# Patient Record
Sex: Female | Born: 1940 | Race: Black or African American | Hispanic: No | Marital: Married | Smoking: Never smoker
Health system: Southern US, Community
[De-identification: ages and names within clinical notes are randomized; demographics above are authoritative.]

## PROBLEM LIST (undated history)

## (undated) DIAGNOSIS — K219 Gastro-esophageal reflux disease without esophagitis: Secondary | ICD-10-CM

## (undated) DIAGNOSIS — M1990 Unspecified osteoarthritis, unspecified site: Secondary | ICD-10-CM

## (undated) DIAGNOSIS — E785 Hyperlipidemia, unspecified: Secondary | ICD-10-CM

## (undated) DIAGNOSIS — I10 Essential (primary) hypertension: Secondary | ICD-10-CM

## (undated) DIAGNOSIS — N189 Chronic kidney disease, unspecified: Secondary | ICD-10-CM

## (undated) HISTORY — PX: ABDOMINAL HYSTERECTOMY: SHX81

## (undated) HISTORY — PX: JOINT REPLACEMENT: SHX530

---

## 2003-12-16 ENCOUNTER — Inpatient Hospital Stay (HOSPITAL_COMMUNITY): Admission: RE | Admit: 2003-12-16 | Discharge: 2003-12-20 | Payer: Self-pay | Admitting: Orthopedic Surgery

## 2004-05-01 ENCOUNTER — Observation Stay (HOSPITAL_COMMUNITY): Admission: RE | Admit: 2004-05-01 | Discharge: 2004-05-02 | Payer: Self-pay | Admitting: Orthopedic Surgery

## 2011-08-10 ENCOUNTER — Encounter (HOSPITAL_COMMUNITY): Payer: Self-pay | Admitting: Respiratory Therapy

## 2011-08-13 ENCOUNTER — Encounter (HOSPITAL_COMMUNITY): Payer: Self-pay

## 2011-08-13 ENCOUNTER — Encounter (HOSPITAL_COMMUNITY)
Admission: RE | Admit: 2011-08-13 | Discharge: 2011-08-13 | Disposition: A | Discharge status: Home / Self Care | Payer: Medicare Other | Source: Ambulatory Visit | Attending: Orthopedic Surgery | Admitting: Orthopedic Surgery

## 2011-08-13 HISTORY — DX: Chronic kidney disease, unspecified: N18.9

## 2011-08-13 HISTORY — DX: Gastro-esophageal reflux disease without esophagitis: K21.9

## 2011-08-13 HISTORY — DX: Unspecified osteoarthritis, unspecified site: M19.90

## 2011-08-13 HISTORY — DX: Essential (primary) hypertension: I10

## 2011-08-13 HISTORY — DX: Hyperlipidemia, unspecified: E78.5

## 2011-08-13 LAB — DIFFERENTIAL
Basophils Absolute: 0.1 10*3/uL (ref 0.0–0.1)
Lymphocytes Relative: 37 % (ref 12–46)
Monocytes Absolute: 0.5 10*3/uL (ref 0.1–1.0)
Monocytes Relative: 10 % (ref 3–12)
Neutro Abs: 2.4 10*3/uL (ref 1.7–7.7)
Neutrophils Relative %: 47 % (ref 43–77)

## 2011-08-13 LAB — BASIC METABOLIC PANEL
Chloride: 98 mEq/L (ref 96–112)
GFR calc Af Amer: 66 mL/min — ABNORMAL LOW (ref >90)
GFR calc non Af Amer: 57 mL/min — ABNORMAL LOW (ref >90)
Potassium: 3.7 mEq/L (ref 3.5–5.1)
Sodium: 136 mEq/L (ref 135–145)

## 2011-08-13 LAB — CBC
HCT: 38 % (ref 36.0–46.0)
Hemoglobin: 12.2 g/dL (ref 12.0–15.0)
WBC: 5.2 10*3/uL (ref 4.0–10.5)

## 2011-08-13 LAB — URINE MICROSCOPIC-ADD ON

## 2011-08-13 LAB — PROTIME-INR: INR: 0.92 (ref 0.00–1.49)

## 2011-08-13 LAB — TYPE AND SCREEN: Antibody Screen: NEGATIVE

## 2011-08-13 LAB — URINALYSIS, ROUTINE W REFLEX MICROSCOPIC
Bilirubin Urine: NEGATIVE
Hgb urine dipstick: NEGATIVE
Urobilinogen, UA: 0.2 mg/dL (ref 0.0–1.0)

## 2011-08-13 LAB — SURGICAL PCR SCREEN: Staphylococcus aureus: NEGATIVE

## 2011-08-13 LAB — APTT: aPTT: 33 seconds (ref 24–37)

## 2011-08-13 NOTE — Pre-Procedure Instructions (Signed)
20 Tanya Munoz  08/13/2011   Your procedure is scheduled on:  08-17-2011 Friday  Report to [facility] at 5:30 AM.  Call this number if you have problems the morning of surgery: [phone]   Remember:   Do not eat food:After Midnight.  Do not drink clear liquids: 4 Hours before arrival.  Take these medicines the morning of surgery with A SIP OF WATER: carvedilol   Do not wear jewelry, make-up or nail polish.  Do not wear lotions, powders, or perfumes. You may wear deodorant.  Do not shave 48 hours prior to surgery.  Do not bring valuables to the hospital.  Contacts, dentures or bridgework may not be worn into surgery.  Leave suitcase in the car. After surgery it may be brought to your room.  For patients admitted to the hospital, checkout time is 11:00 AM the day of discharge.   Patients discharged the day of surgery will not be allowed to drive home.  Name and phone number of your driver: husband brendon neyens   [phone]  Special Instructions: Incentive Spirometry - Practice and bring it with you on the day of surgery. and CHG Shower Use Special Wash: 1/2 bottle night before surgery and 1/2 bottle morning of surgery.   Please read over the following fact sheets that you were given: Pain Booklet, Coughing and Deep Breathing, Blood Transfusion Information, MRSA Information and Surgical Site Infection Prevention

## 2011-08-13 NOTE — Progress Notes (Signed)
Ekg, cxr, echo, stress test requested from [facility] in [place] per Mining engineer.

## 2011-08-14 NOTE — H&P (Signed)
NAME:  Tanya Munoz, Tanya Munoz NO.:  [id number]  MEDICAL RECORD NO.:  [id number]  LOCATION:  [facility]                        FACILITY:  [facility]  PHYSICIAN:  Marcello Moores B. Siddhanth Denk, P.A.  DATE OF BIRTH:  1941-07-08  DATE OF ADMISSION:  07/12/2011 DATE OF DISCHARGE:                             HISTORY & PHYSICAL   CHIEF COMPLAINT:  Left knee pain.  HISTORY OF PRESENT ILLNESS:  The patient is a 70 year old female with worsening left knee pain secondary to end-stage osteoarthritis.  The patient elected to have a left total knee arthroplasty by Dr. Esmond Plants to decrease pain and increase function.  PAST MEDICAL HISTORY:  Hypertension, hyperlipidemia, diabetes mellitus, GERD, and chronic kidney disease along with osteoarthritis.  The patient has no known drug allergies.  CURRENT MEDICATIONS: 1. Aspirin 81 mg daily. 2. Carvedilol 3.125 mg b.i.d. 3. Garlic daily. 4. Losartan/hydrochlorothiazide 100/25 mg daily. 5. Lovastatin 40 mg daily. 6. Metformin 850 mg b.i.d. 7. Omega-3 acid. 8. Lovaza 1 g daily. 9. Tramadol 50 mg 1-2 tabs q.4-6 hours p.r.n. pain.  The patient does not smoke or use alcohol, and is retired.  REVIEW OF SYSTEMS:  Review of systems shows pain with ambulation,  pain with flexion and extension of the left lower extremity.  The patient otherwise review of systems is negative.  PHYSICAL EXAMINATION:  GENERAL:  Patient is healthy and appropriate 38- year-old female, in no acute distress.  Pleasant mood and affect, alert and oriented x3. HEAD AND NECK:  Shows cranial nerves II-XII grossly intact. MUSCULOSKELETAL:  Cervical spine shows full range of motion in difficulty with no tenderness. NEUROVASCULARLY:  She is intact, bilateral upper extremities. CHEST:  Active breath sounds bilaterally.  No wheezes, rhonchi, or rales. ABDOMEN:  Nontender, nondistended. EXTREMITIES:  Bilateral lower extremities show some moderate tenderness in the medial joint line,  especially in the left knee with a trace effusion,  mild crepitus range of motion.  No positive mass.  Calves nontender.  Trace edema, but neurovascularly she is intact with no rashes.  She does have mildly antalgic gait.  Radiographs reviewed showing end-stage osteoarthritis to left knee.  IMPRESSION:  End-stage osteoarthritis, left knee.  PLAN OF ACTION:  Left total knee arthroplasty by Dr. Esmond Plants.     Darlena Koval B. Mayo Ao.     TBD/MEDQ  D:  08/14/2011  T:  08/14/2011  Job:  [id number]

## 2011-08-16 MED ORDER — CEFAZOLIN SODIUM-DEXTROSE 2-3 GM-% IV SOLR
2.0000 g | Freq: Once | INTRAVENOUS | Status: AC
  Administered 2011-08-17: 2 g via INTRAVENOUS
  Filled 2011-08-16: qty 50

## 2011-08-17 ENCOUNTER — Inpatient Hospital Stay (HOSPITAL_COMMUNITY): Payer: Medicare Other

## 2011-08-17 ENCOUNTER — Encounter (HOSPITAL_COMMUNITY): Payer: Self-pay | Admitting: *Deleted

## 2011-08-17 ENCOUNTER — Encounter (HOSPITAL_COMMUNITY): Payer: Self-pay | Admitting: Anesthesiology

## 2011-08-17 ENCOUNTER — Inpatient Hospital Stay (HOSPITAL_COMMUNITY)
Admission: RE | Admit: 2011-08-17 | Discharge: 2011-08-20 | DRG: 470 | Disposition: A | Discharge status: Home Health | Payer: Medicare Other | Source: Ambulatory Visit | Attending: Orthopedic Surgery | Admitting: Orthopedic Surgery

## 2011-08-17 ENCOUNTER — Encounter (HOSPITAL_COMMUNITY): Payer: Self-pay | Admitting: Orthopedic Surgery

## 2011-08-17 ENCOUNTER — Other Ambulatory Visit: Payer: Self-pay

## 2011-08-17 ENCOUNTER — Inpatient Hospital Stay (HOSPITAL_COMMUNITY): Payer: Medicare Other | Admitting: Anesthesiology

## 2011-08-17 ENCOUNTER — Encounter (HOSPITAL_COMMUNITY)
Admission: RE | Disposition: A | Discharge status: Home Health | Payer: Self-pay | Source: Ambulatory Visit | Attending: Orthopedic Surgery

## 2011-08-17 DIAGNOSIS — Z7982 Long term (current) use of aspirin: Secondary | ICD-10-CM

## 2011-08-17 DIAGNOSIS — K219 Gastro-esophageal reflux disease without esophagitis: Secondary | ICD-10-CM | POA: Diagnosis present

## 2011-08-17 DIAGNOSIS — M1710 Unilateral primary osteoarthritis, unspecified knee: Principal | ICD-10-CM | POA: Diagnosis present

## 2011-08-17 DIAGNOSIS — IMO0002 Reserved for concepts with insufficient information to code with codable children: Secondary | ICD-10-CM | POA: Diagnosis present

## 2011-08-17 DIAGNOSIS — Z01810 Encounter for preprocedural cardiovascular examination: Secondary | ICD-10-CM

## 2011-08-17 DIAGNOSIS — Z01812 Encounter for preprocedural laboratory examination: Secondary | ICD-10-CM

## 2011-08-17 DIAGNOSIS — E785 Hyperlipidemia, unspecified: Secondary | ICD-10-CM | POA: Diagnosis present

## 2011-08-17 DIAGNOSIS — E119 Type 2 diabetes mellitus without complications: Secondary | ICD-10-CM | POA: Diagnosis present

## 2011-08-17 DIAGNOSIS — N189 Chronic kidney disease, unspecified: Secondary | ICD-10-CM | POA: Diagnosis present

## 2011-08-17 DIAGNOSIS — Z7901 Long term (current) use of anticoagulants: Secondary | ICD-10-CM

## 2011-08-17 DIAGNOSIS — I129 Hypertensive chronic kidney disease with stage 1 through stage 4 chronic kidney disease, or unspecified chronic kidney disease: Secondary | ICD-10-CM | POA: Diagnosis present

## 2011-08-17 DIAGNOSIS — Z87440 Personal history of urinary (tract) infections: Secondary | ICD-10-CM

## 2011-08-17 DIAGNOSIS — Z79899 Other long term (current) drug therapy: Secondary | ICD-10-CM

## 2011-08-17 HISTORY — PX: TOTAL KNEE ARTHROPLASTY: SHX125

## 2011-08-17 LAB — DIFFERENTIAL
Basophils Absolute: 0 10*3/uL (ref 0.0–0.1)
Basophils Relative: 0 % (ref 0–1)
Eosinophils Absolute: 0.1 10*3/uL (ref 0.0–0.7)
Eosinophils Relative: 1 % (ref 0–5)
Neutrophils Relative %: 77 % (ref 43–77)

## 2011-08-17 LAB — GLUCOSE, CAPILLARY
Glucose-Capillary: 129 mg/dL — ABNORMAL HIGH (ref 70–99)
Glucose-Capillary: 161 mg/dL — ABNORMAL HIGH (ref 70–99)

## 2011-08-17 SURGERY — ARTHROPLASTY, KNEE, TOTAL
Anesthesia: General | Site: Knee | Laterality: Left | Wound class: Clean

## 2011-08-17 MED ORDER — FLEET ENEMA 7-19 GM/118ML RE ENEM: 1.0000 | ENEMA | Freq: Every day | RECTAL | Status: DC | PRN

## 2011-08-17 MED ORDER — POTASSIUM CHLORIDE IN NACL 20-0.9 MEQ/L-% IV SOLN
INTRAVENOUS | Status: DC
  Administered 2011-08-18: 50 mL/h via INTRAVENOUS
  Administered 2011-08-18: 03:00:00 via INTRAVENOUS
  Filled 2011-08-17 (×5): qty 1000

## 2011-08-17 MED ORDER — MIDAZOLAM HCL 5 MG/5ML IJ SOLN
INTRAMUSCULAR | Status: DC | PRN
  Administered 2011-08-17: 1 mg via INTRAVENOUS

## 2011-08-17 MED ORDER — TRAMADOL HCL 50 MG PO TABS: 50.0000 mg | ORAL_TABLET | Freq: Four times a day (QID) | ORAL | Status: DC | PRN

## 2011-08-17 MED ORDER — KETOROLAC TROMETHAMINE 30 MG/ML IJ SOLN
15.0000 mg | Freq: Once | INTRAMUSCULAR | Status: AC | PRN
  Administered 2011-08-17: 30 mg via INTRAVENOUS

## 2011-08-17 MED ORDER — METOCLOPRAMIDE HCL 5 MG/ML IJ SOLN
5.0000 mg | Freq: Three times a day (TID) | INTRAMUSCULAR | Status: DC | PRN
  Filled 2011-08-17: qty 2

## 2011-08-17 MED ORDER — DOCUSATE SODIUM 100 MG PO CAPS
100.0000 mg | ORAL_CAPSULE | Freq: Two times a day (BID) | ORAL | Status: DC
  Administered 2011-08-17 – 2011-08-20 (×7): 100 mg via ORAL
  Filled 2011-08-17 (×9): qty 1

## 2011-08-17 MED ORDER — ONDANSETRON HCL 4 MG/2ML IJ SOLN: 4.0000 mg | Freq: Four times a day (QID) | INTRAMUSCULAR | Status: DC | PRN

## 2011-08-17 MED ORDER — COUMADIN BOOK
Freq: Once | Status: AC
  Administered 2011-08-17: 1
  Filled 2011-08-17: qty 1

## 2011-08-17 MED ORDER — WARFARIN SODIUM 7.5 MG PO TABS
7.5000 mg | ORAL_TABLET | Freq: Once | ORAL | Status: AC
  Administered 2011-08-17: 7.5 mg via ORAL
  Filled 2011-08-17: qty 1

## 2011-08-17 MED ORDER — ONDANSETRON HCL 4 MG/2ML IJ SOLN
INTRAMUSCULAR | Status: DC | PRN
  Administered 2011-08-17: 4 mg via INTRAVENOUS

## 2011-08-17 MED ORDER — METOCLOPRAMIDE HCL 10 MG PO TABS: 5.0000 mg | ORAL_TABLET | Freq: Three times a day (TID) | ORAL | Status: DC | PRN

## 2011-08-17 MED ORDER — ROCURONIUM BROMIDE 100 MG/10ML IV SOLN
INTRAVENOUS | Status: DC | PRN
  Administered 2011-08-17: 50 mg via INTRAVENOUS

## 2011-08-17 MED ORDER — ASPIRIN 81 MG PO TABS
81.0000 mg | ORAL_TABLET | Freq: Every day | ORAL | Status: DC
  Administered 2011-08-17 – 2011-08-19 (×3): 81 mg via ORAL
  Filled 2011-08-17 (×4): qty 1

## 2011-08-17 MED ORDER — BISACODYL 5 MG PO TBEC
10.0000 mg | DELAYED_RELEASE_TABLET | Freq: Every day | ORAL | Status: DC | PRN
  Administered 2011-08-19: 10 mg via ORAL
  Filled 2011-08-17: qty 2

## 2011-08-17 MED ORDER — FENTANYL CITRATE 0.05 MG/ML IJ SOLN
INTRAMUSCULAR | Status: DC | PRN
  Administered 2011-08-17: 50 ug via INTRAVENOUS
  Administered 2011-08-17: 25 ug via INTRAVENOUS
  Administered 2011-08-17 (×2): 50 ug via INTRAVENOUS
  Administered 2011-08-17: 25 ug via INTRAVENOUS

## 2011-08-17 MED ORDER — PHENOL 1.4 % MT LIQD
1.0000 | OROMUCOSAL | Status: DC | PRN
  Administered 2011-08-18: 1 via OROMUCOSAL
  Filled 2011-08-17: qty 177

## 2011-08-17 MED ORDER — BUPIVACAINE HCL (PF) 0.5 % IJ SOLN
INTRAMUSCULAR | Status: DC | PRN
  Administered 2011-08-17: 30 mL

## 2011-08-17 MED ORDER — OXYCODONE-ACETAMINOPHEN 5-325 MG PO TABS: 1.0000 | ORAL_TABLET | ORAL | Status: AC | PRN

## 2011-08-17 MED ORDER — POLYETHYLENE GLYCOL 3350 17 G PO PACK
17.0000 g | PACK | Freq: Every day | ORAL | Status: DC | PRN
  Administered 2011-08-18: 17 g via ORAL
  Filled 2011-08-17: qty 1

## 2011-08-17 MED ORDER — METHOCARBAMOL 100 MG/ML IJ SOLN: 500.0000 mg | Freq: Four times a day (QID) | INTRAVENOUS | Status: DC | PRN

## 2011-08-17 MED ORDER — IRON 325 (65 FE) MG PO TABS: 1.0000 | ORAL_TABLET | Freq: Two times a day (BID) | ORAL | Status: DC

## 2011-08-17 MED ORDER — MAGNESIUM HYDROXIDE 400 MG/5ML PO SUSP: 30.0000 mL | Freq: Two times a day (BID) | ORAL | Status: DC | PRN

## 2011-08-17 MED ORDER — ACETAMINOPHEN 325 MG PO TABS: 650.0000 mg | ORAL_TABLET | Freq: Four times a day (QID) | ORAL | Status: DC | PRN

## 2011-08-17 MED ORDER — CEFAZOLIN SODIUM-DEXTROSE 2-3 GM-% IV SOLR
2.0000 g | Freq: Four times a day (QID) | INTRAVENOUS | Status: AC
  Administered 2011-08-17 – 2011-08-18 (×2): 2 g via INTRAVENOUS
  Filled 2011-08-17 (×4): qty 50

## 2011-08-17 MED ORDER — MEPERIDINE HCL 25 MG/ML IJ SOLN: 6.2500 mg | INTRAMUSCULAR | Status: DC | PRN

## 2011-08-17 MED ORDER — HYDROMORPHONE HCL PF 1 MG/ML IJ SOLN: 0.5000 mg | INTRAMUSCULAR | Status: DC | PRN

## 2011-08-17 MED ORDER — HYDROMORPHONE HCL PF 1 MG/ML IJ SOLN
INTRAMUSCULAR | Status: AC
  Filled 2011-08-17: qty 1

## 2011-08-17 MED ORDER — CARVEDILOL 3.125 MG PO TABS
3.1250 mg | ORAL_TABLET | Freq: Two times a day (BID) | ORAL | Status: DC
  Administered 2011-08-17 – 2011-08-20 (×6): 3.125 mg via ORAL
  Filled 2011-08-17 (×9): qty 1

## 2011-08-17 MED ORDER — WARFARIN SODIUM 5 MG PO TABS: 5.0000 mg | ORAL_TABLET | Freq: Every day | ORAL | Status: DC

## 2011-08-17 MED ORDER — METHOCARBAMOL 500 MG PO TABS
500.0000 mg | ORAL_TABLET | Freq: Four times a day (QID) | ORAL | Status: DC | PRN
  Administered 2011-08-17 – 2011-08-19 (×4): 500 mg via ORAL
  Filled 2011-08-17 (×5): qty 1

## 2011-08-17 MED ORDER — METHOCARBAMOL 500 MG PO TABS: 500.0000 mg | ORAL_TABLET | Freq: Four times a day (QID) | ORAL | Status: AC | PRN

## 2011-08-17 MED ORDER — FERROUS SULFATE 325 (65 FE) MG PO TABS
325.0000 mg | ORAL_TABLET | Freq: Three times a day (TID) | ORAL | Status: DC
  Administered 2011-08-17 – 2011-08-20 (×9): 325 mg via ORAL
  Filled 2011-08-17 (×12): qty 1

## 2011-08-17 MED ORDER — LOSARTAN POTASSIUM 50 MG PO TABS
100.0000 mg | ORAL_TABLET | Freq: Every day | ORAL | Status: DC
  Administered 2011-08-18 – 2011-08-20 (×3): 100 mg via ORAL
  Filled 2011-08-17 (×4): qty 2

## 2011-08-17 MED ORDER — LOSARTAN POTASSIUM-HCTZ 100-25 MG PO TABS: 1.0000 | ORAL_TABLET | Freq: Every day | ORAL | Status: DC

## 2011-08-17 MED ORDER — OXYCODONE-ACETAMINOPHEN 5-325 MG PO TABS
1.0000 | ORAL_TABLET | ORAL | Status: DC | PRN
  Administered 2011-08-17 – 2011-08-20 (×13): 2 via ORAL
  Filled 2011-08-17 (×13): qty 2

## 2011-08-17 MED ORDER — LACTATED RINGERS IV SOLN
INTRAVENOUS | Status: DC | PRN
  Administered 2011-08-17 (×3): via INTRAVENOUS

## 2011-08-17 MED ORDER — WARFARIN VIDEO
Freq: Once | Status: AC
  Administered 2011-08-18: 13:00:00
  Filled 2011-08-17: qty 1

## 2011-08-17 MED ORDER — SIMVASTATIN 20 MG PO TABS
20.0000 mg | ORAL_TABLET | Freq: Every day | ORAL | Status: DC
  Administered 2011-08-17 – 2011-08-19 (×3): 20 mg via ORAL
  Filled 2011-08-17 (×4): qty 1

## 2011-08-17 MED ORDER — PROPOFOL 10 MG/ML IV EMUL
INTRAVENOUS | Status: DC | PRN
  Administered 2011-08-17: 120 mg via INTRAVENOUS

## 2011-08-17 MED ORDER — PROMETHAZINE HCL 25 MG/ML IJ SOLN: 6.2500 mg | INTRAMUSCULAR | Status: DC | PRN

## 2011-08-17 MED ORDER — KETOROLAC TROMETHAMINE 30 MG/ML IJ SOLN
INTRAMUSCULAR | Status: AC
  Filled 2011-08-17: qty 1

## 2011-08-17 MED ORDER — CELECOXIB 200 MG PO CAPS: 200.0000 mg | ORAL_CAPSULE | Freq: Every day | ORAL | Status: DC

## 2011-08-17 MED ORDER — OMEGA-3-ACID ETHYL ESTERS 1 G PO CAPS
1.0000 g | ORAL_CAPSULE | Freq: Every day | ORAL | Status: DC
  Administered 2011-08-17 – 2011-08-20 (×4): 1 g via ORAL
  Filled 2011-08-17 (×4): qty 1

## 2011-08-17 MED ORDER — MENTHOL 3 MG MT LOZG: 1.0000 | LOZENGE | OROMUCOSAL | Status: DC | PRN

## 2011-08-17 MED ORDER — GLYCOPYRROLATE 0.2 MG/ML IJ SOLN
INTRAMUSCULAR | Status: DC | PRN
  Administered 2011-08-17: .8 mg via INTRAVENOUS

## 2011-08-17 MED ORDER — HYDROCHLOROTHIAZIDE 25 MG PO TABS
25.0000 mg | ORAL_TABLET | Freq: Every day | ORAL | Status: DC
  Administered 2011-08-17 – 2011-08-20 (×4): 25 mg via ORAL
  Filled 2011-08-17 (×4): qty 1

## 2011-08-17 MED ORDER — NEOSTIGMINE METHYLSULFATE 1 MG/ML IJ SOLN
INTRAMUSCULAR | Status: DC | PRN
  Administered 2011-08-17: 4 mg via INTRAVENOUS

## 2011-08-17 MED ORDER — HYDROMORPHONE HCL PF 1 MG/ML IJ SOLN
0.2500 mg | INTRAMUSCULAR | Status: DC | PRN
  Administered 2011-08-17 (×4): 0.5 mg via INTRAVENOUS

## 2011-08-17 MED ORDER — METFORMIN HCL 850 MG PO TABS
850.0000 mg | ORAL_TABLET | Freq: Two times a day (BID) | ORAL | Status: DC
  Administered 2011-08-17 – 2011-08-20 (×6): 850 mg via ORAL
  Filled 2011-08-17 (×10): qty 1

## 2011-08-17 MED ORDER — SODIUM CHLORIDE 0.9 % IR SOLN
Status: DC | PRN
  Administered 2011-08-17: 1000 mL
  Administered 2011-08-17: 3000 mL

## 2011-08-17 SURGICAL SUPPLY — 65 items
BANDAGE ELASTIC 4 VELCRO ST LF (GAUZE/BANDAGES/DRESSINGS) ×2 IMPLANT
BANDAGE ELASTIC 6 VELCRO ST LF (GAUZE/BANDAGES/DRESSINGS) ×2 IMPLANT
BANDAGE ESMARK 6X9 LF (GAUZE/BANDAGES/DRESSINGS) ×1 IMPLANT
BANDAGE GAUZE ELAST BULKY 4 IN (GAUZE/BANDAGES/DRESSINGS) ×2 IMPLANT
BLADE SAG 18X100X1.27 (BLADE) ×2 IMPLANT
BLADE SAW SGTL 13.0X1.19X90.0M (BLADE) ×2 IMPLANT
BLADE SURG 10 STRL SS (BLADE) ×2 IMPLANT
BNDG ELASTIC 6X10 VLCR STRL LF (GAUZE/BANDAGES/DRESSINGS) ×2 IMPLANT
BNDG ESMARK 6X9 LF (GAUZE/BANDAGES/DRESSINGS) ×2
BOWL SMART MIX CTS (DISPOSABLE) ×2 IMPLANT
CEMENT HV SMART SET (Cement) ×4 IMPLANT
CLOSURE STERI STRIP 1/2 X4 (GAUZE/BANDAGES/DRESSINGS) ×2 IMPLANT
CLOTH BEACON ORANGE TIMEOUT ST (SAFETY) ×2 IMPLANT
COVER BACK TABLE 24X17X13 BIG (DRAPES) IMPLANT
COVER SURGICAL LIGHT HANDLE (MISCELLANEOUS) ×2 IMPLANT
CUFF TOURNIQUET SINGLE 34IN LL (TOURNIQUET CUFF) ×2 IMPLANT
CUFF TOURNIQUET SINGLE 44IN (TOURNIQUET CUFF) IMPLANT
DRAPE EXTREMITY T 121X128X90 (DRAPE) ×2 IMPLANT
DRAPE PROXIMA HALF (DRAPES) ×2 IMPLANT
DRAPE U-SHAPE 47X51 STRL (DRAPES) ×2 IMPLANT
DRSG ADAPTIC 3X8 NADH LF (GAUZE/BANDAGES/DRESSINGS) ×2 IMPLANT
DRSG PAD ABDOMINAL 8X10 ST (GAUZE/BANDAGES/DRESSINGS) ×2 IMPLANT
DURAPREP 26ML APPLICATOR (WOUND CARE) ×2 IMPLANT
ELECT CAUTERY BLADE 6.4 (BLADE) ×2 IMPLANT
ELECT REM PT RETURN 9FT ADLT (ELECTROSURGICAL) ×2
ELECTRODE REM PT RTRN 9FT ADLT (ELECTROSURGICAL) ×1 IMPLANT
FACESHIELD LNG OPTICON STERILE (SAFETY) IMPLANT
GLOVE BIOGEL PI ORTHO PRO 7.5 (GLOVE) ×1
GLOVE BIOGEL PI ORTHO PRO SZ7 (GLOVE) ×1
GLOVE BIOGEL PI ORTHO PRO SZ8 (GLOVE) ×1
GLOVE ECLIPSE 8.5 STRL (GLOVE) ×2 IMPLANT
GLOVE ORTHO TXT STRL SZ7.5 (GLOVE) ×2 IMPLANT
GLOVE PI ORTHO PRO STRL 7.5 (GLOVE) ×1 IMPLANT
GLOVE PI ORTHO PRO STRL SZ7 (GLOVE) ×1 IMPLANT
GLOVE PI ORTHO PRO STRL SZ8 (GLOVE) ×1 IMPLANT
GLOVE SURG ORTHO 8.5 STRL (GLOVE) ×2 IMPLANT
GLOVE SURG SS PI 7.0 STRL IVOR (GLOVE) ×2 IMPLANT
GLOVE SURG SS PI 8.5 STRL IVOR (GLOVE) ×1
GLOVE SURG SS PI 8.5 STRL STRW (GLOVE) ×1 IMPLANT
GOWN PREVENTION PLUS XXLARGE (GOWN DISPOSABLE) ×8 IMPLANT
GOWN STRL NON-REIN LRG LVL3 (GOWN DISPOSABLE) IMPLANT
HANDPIECE INTERPULSE COAX TIP (DISPOSABLE) ×1
IMMOBILIZER KNEE 22 UNIV (SOFTGOODS) ×2 IMPLANT
KIT BASIN OR (CUSTOM PROCEDURE TRAY) ×2 IMPLANT
KIT MANIFOLD (MISCELLANEOUS) ×2 IMPLANT
KIT ROOM TURNOVER OR (KITS) ×2 IMPLANT
MANIFOLD NEPTUNE II (INSTRUMENTS) ×2 IMPLANT
NS IRRIG 1000ML POUR BTL (IV SOLUTION) ×2 IMPLANT
PACK TOTAL JOINT (CUSTOM PROCEDURE TRAY) ×2 IMPLANT
PAD ARMBOARD 7.5X6 YLW CONV (MISCELLANEOUS) ×2 IMPLANT
SET HNDPC FAN SPRY TIP SCT (DISPOSABLE) ×1 IMPLANT
SPONGE GAUZE 4X4 12PLY (GAUZE/BANDAGES/DRESSINGS) ×2 IMPLANT
STRIP CLOSURE SKIN 1/2X4 (GAUZE/BANDAGES/DRESSINGS) IMPLANT
SUCTION FRAZIER TIP 10 FR DISP (SUCTIONS) ×2 IMPLANT
SUT MNCRL AB 3-0 PS2 18 (SUTURE) ×2 IMPLANT
SUT VIC AB 0 CT1 27 (SUTURE) ×1
SUT VIC AB 0 CT1 27XBRD ANBCTR (SUTURE) ×1 IMPLANT
SUT VIC AB 1 CT1 27 (SUTURE) ×2
SUT VIC AB 1 CT1 27XBRD ANBCTR (SUTURE) ×2 IMPLANT
SUT VIC AB 2-0 CT1 27 (SUTURE) ×2
SUT VIC AB 2-0 CT1 TAPERPNT 27 (SUTURE) ×2 IMPLANT
TOWEL OR 17X24 6PK STRL BLUE (TOWEL DISPOSABLE) ×2 IMPLANT
TOWEL OR 17X26 10 PK STRL BLUE (TOWEL DISPOSABLE) ×2 IMPLANT
TRAY FOLEY CATH 14FR (SET/KITS/TRAYS/PACK) ×2 IMPLANT
WATER STERILE IRR 1000ML POUR (IV SOLUTION) ×8 IMPLANT

## 2011-08-17 NOTE — Preoperative (Signed)
Beta Blockers   Reason not to administer Beta Blockers:Coreg 0330 08/17/2011

## 2011-08-17 NOTE — Discharge Summary (Signed)
Physician Discharge Summary  Patient ID: Tanya Munoz MRN: [id number] DOB/AGE: [birth year] 70 y.o.  Admit date: 08/17/2011 Discharge date: 08/17/2011  Admission Diagnoses:  Principal Problem:  *Osteoarthrosis, unspecified whether generalized or localized, lower leg   Discharge Diagnoses:  Same   Surgeries: Procedure(s): TOTAL KNEE ARTHROPLASTY on 08/17/2011   Consultants: Physcial Therapy, Occupational Therapy, Social Work Discharged Condition: Stable  Hospital Course: Tanya Munoz is an 70 y.o. female who was admitted 08/17/2011 with a chief complaint of No chief complaint on file. , and found to have a diagnosis of Osteoarthrosis, unspecified whether generalized or localized, lower leg.  They were brought to the operating room on 08/17/2011 and underwent the above named procedures.    The patient had an uncomplicated hospital course and was stable for discharge.  Recent vital signs:  Filed Vitals:   08/17/11 0559  BP: 107/71  Pulse: 80  Temp: 98.4 F (36.9 C)  Resp: 18    Recent laboratory studies:  Results for orders placed during the hospital encounter of 08/13/11  APTT      Component Value Range   aPTT 33  24 - 37 (seconds)  BASIC METABOLIC PANEL      Component Value Range   Sodium 136  135 - 145 (mEq/L)   Potassium 3.7  3.5 - 5.1 (mEq/L)   Chloride 98  96 - 112 (mEq/L)   CO2 25  19 - 32 (mEq/L)   Glucose, Bld 139 (*) 70 - 99 (mg/dL)   BUN 19  6 - 23 (mg/dL)   Creatinine, Ser 0.97  0.50 - 1.10 (mg/dL)   Calcium 10.3  8.4 - 10.5 (mg/dL)   GFR calc non Af Amer 57 (*) >90 (mL/min)   GFR calc Af Amer 66 (*) >90 (mL/min)  CBC      Component Value Range   WBC 5.2  4.0 - 10.5 (K/uL)   RBC 4.16  3.87 - 5.11 (MIL/uL)   Hemoglobin 12.2  12.0 - 15.0 (g/dL)   HCT 38.0  36.0 - 46.0 (%)   MCV 91.3  78.0 - 100.0 (fL)   MCH 29.3  26.0 - 34.0 (pg)   MCHC 32.1  30.0 - 36.0 (g/dL)   RDW 13.3  11.5 - 15.5 (%)   Platelets 249  150 - 400 (K/uL)  DIFFERENTIAL    Component Value Range   Neutrophils Relative 47  43 - 77 (%)   Neutro Abs 2.4  1.7 - 7.7 (K/uL)   Lymphocytes Relative 37  12 - 46 (%)   Lymphs Abs 1.9  0.7 - 4.0 (K/uL)   Monocytes Relative 10  3 - 12 (%)   Monocytes Absolute 0.5  0.1 - 1.0 (K/uL)   Eosinophils Relative 5  0 - 5 (%)   Eosinophils Absolute 0.3  0.0 - 0.7 (K/uL)   Basophils Relative 1  0 - 1 (%)   Basophils Absolute 0.1  0.0 - 0.1 (K/uL)  PROTIME-INR      Component Value Range   Prothrombin Time 12.6  11.6 - 15.2 (seconds)   INR 0.92  0.00 - 1.49   URINALYSIS, ROUTINE W REFLEX MICROSCOPIC      Component Value Range   Color, Urine YELLOW  YELLOW    Appearance CLOUDY (*) CLEAR    Specific Gravity, Urine 1.018  1.005 - 1.030    pH 6.5  5.0 - 8.0    Glucose, UA NEGATIVE  NEGATIVE (mg/dL)   Hgb urine dipstick NEGATIVE  NEGATIVE  Bilirubin Urine NEGATIVE  NEGATIVE    Ketones, ur NEGATIVE  NEGATIVE (mg/dL)   Protein, ur NEGATIVE  NEGATIVE (mg/dL)   Urobilinogen, UA 0.2  0.0 - 1.0 (mg/dL)   Nitrite POSITIVE (*) NEGATIVE    Leukocytes, UA MODERATE (*) NEGATIVE   TYPE AND SCREEN      Component Value Range   ABO/RH(D) O POS     Antibody Screen NEG     Sample Expiration 08/27/2011    SURGICAL PCR SCREEN      Component Value Range   MRSA, PCR NEGATIVE  NEGATIVE    Staphylococcus aureus NEGATIVE  NEGATIVE   URINE MICROSCOPIC-ADD ON      Component Value Range   Squamous Epithelial / LPF FEW (*) RARE    WBC, UA 11-20  <3 (WBC/hpf)   RBC / HPF 0-2  <3 (RBC/hpf)   Bacteria, UA MANY (*) RARE    Urine-Other MUCOUS PRESENT    ABO/RH      Component Value Range   ABO/RH(D) O POS      Discharge Medications:   Current Discharge Medication List    START taking these medications   Details  Ferrous Sulfate (IRON) 325 (65 FE) MG TABS Take 1 tablet by mouth 2 (two) times daily. Qty: 30 each, Refills: 0    methocarbamol (ROBAXIN) 500 MG tablet Take 1 tablet (500 mg total) by mouth 4 (four) times daily as needed (for  spasms). Qty: 60 tablet, Refills: 1    oxyCODONE-acetaminophen (ROXICET) 5-325 MG per tablet Take 1-2 tablets by mouth every 4 (four) hours as needed for pain. Qty: 60 tablet, Refills: 0    warfarin (COUMADIN) 5 MG tablet Take 1 tablet (5 mg total) by mouth daily. As directed per home health pharmacy Qty: 40 tablet, Refills: 0      CONTINUE these medications which have NOT CHANGED   Details  aspirin 81 MG tablet Take 81 mg by mouth daily.      carvedilol (COREG) 3.125 MG tablet Take 3.125 mg by mouth 2 (two) times daily with a meal.      GARLIC PO Take 1 tablet by mouth daily.      losartan-hydrochlorothiazide (HYZAAR) 100-25 MG per tablet Take 1 tablet by mouth daily.      lovastatin (MEVACOR) 40 MG tablet Take 40 mg by mouth at bedtime.      metFORMIN (GLUCOPHAGE) 850 MG tablet Take 850 mg by mouth 2 (two) times daily with a meal.      omega-3 acid ethyl esters (LOVAZA) 1 G capsule Take 1 g by mouth daily.      traMADol (ULTRAM) 50 MG tablet Take 50 mg by mouth every 6 (six) hours as needed. Maximum dose= 8 tablets per day For pain         Diagnostic Studies: Dg Chest 2 View  08/17/2011  *RADIOLOGY REPORT*  Clinical Data: Preoperative chest radiograph for left total knee replacement.  CHEST - 2 VIEW  Comparison: Chest radiograph performed 12/13/2003  Findings: The lungs are well-aerated and clear.  There is no evidence of focal opacification, pleural effusion or pneumothorax.  The heart is normal in size; calcification is noted within the aortic arch.  No acute osseous abnormalities are seen.  Clips are noted within the right upper quadrant, reflecting prior cholecystectomy.  IMPRESSION: No acute cardiopulmonary process seen.  Original Report Authenticated By: Santa Lighter, M.D.    Disposition:     Follow-up Information    Follow up with Krithik Mapel,STEVEN R.  Make an appointment in 2 weeks.   Contact information:   [facility] [address], [place] [id number]           Signed: Augustin Schooling 08/17/2011, 10:00 AM

## 2011-08-17 NOTE — Anesthesia Procedure Notes (Addendum)
Anesthesia Regional Block:  Femoral nerve block  Pre-Anesthetic Checklist: ,, timeout performed, Correct Patient, Correct Site, Correct Laterality, Correct Procedure, Correct Position, site marked, Risks and benefits discussed, at surgeon's request and post-op pain management   Prep: Betadine       Needles:  Injection technique: Single-shot  Needle Type: Stimulator Needle - 80      Needle Gauge: 22 and 22 G  Needle insertion depth: 6 cm   Additional Needles:  Procedures: nerve stimulator Femoral nerve block  Nerve Stimulator or Paresthesia:  Response: Twitch elicited, 0.8 mA,   Additional Responses:   Narrative:  Start time: 08/17/2011 7:00 AM End time: 08/17/2011 7:13 AM  Performed by: Personally   Additional Notes: BP cuff, EKG monitors applied. Sedation begun. Femoral artery palpated for location of nerve. After nerve location anesthetic injected incrementally, slowly , and after neg aspirations. Tolerated well.  Femoral nerve block Date/Time: 08/17/2011 7:49 AM Performed by: Williemae Area Pre-anesthesia Checklist: Patient identified, Emergency Drugs available, Suction available and Patient being monitored Patient Re-evaluated:Patient Re-evaluated prior to inductionOxygen Delivery Method: Circle System Utilized Preoxygenation: Pre-oxygenation with 100% oxygen Intubation Type: IV induction Ventilation: Mask ventilation without difficulty and Oral airway inserted - appropriate to patient size Laryngoscope Size: Mac and 3 Grade View: Grade I Tube type: Oral Tube size: 7.5 mm Number of attempts: 1 Airway Equipment and Method: stylet Placement Confirmation: ETT inserted through vocal cords under direct vision,  positive ETCO2 and breath sounds checked- equal and bilateral Secured at: 21 cm Tube secured with: Tape

## 2011-08-17 NOTE — Brief Op Note (Signed)
08/17/2011  9:42 AM  PATIENT:  Serita Grammes  70 y.o. female  PRE-OPERATIVE DIAGNOSIS:  Left Knee Osteoarthritis  POST-OPERATIVE DIAGNOSIS:  Left Knee Osteoarthritis  PROCEDURE:  Procedure(s): TOTAL KNEE ARTHROPLASTY  SURGEON:  Surgeon(s): Augustin Schooling  PHYSICIAN ASSISTANT:   ASSISTANTS: Ventura Bruns PA-C   ANESTHESIA:   regional and general  EBL:  Total I/O In: 1000 [I.V.:1000] Out: 425 [Urine:425]  BLOOD ADMINISTERED:none  DRAINS: none   LOCAL MEDICATIONS USED:  NONE  SPECIMEN:  No Specimen  DISPOSITION OF SPECIMEN:  N/A  COUNTS:  YES  TOURNIQUET:  * Missing tourniquet times found for documented tourniquets in log:  4973 *  DICTATION: .Other Dictation: Dictation Number [id number]  PLAN OF CARE: Admit to inpatient   PATIENT DISPOSITION:  PACU - hemodynamically stable.   Delay start of Pharmacological VTE agent (>24hrs) due to surgical blood loss or risk of bleeding:  {YES/NO/NOT APPLICABLE:20182

## 2011-08-17 NOTE — Op Note (Signed)
NAME:  Tanya Munoz, Tanya Munoz NO.:  [id number]  MEDICAL RECORD NO.:  [id number]  LOCATION:  [facility]                         FACILITY:  [facility]  PHYSICIAN:  Doran Heater. Veverly Fells, M.D. DATE OF BIRTH:  [birth year]  DATE OF PROCEDURE:  08/17/2011 DATE OF DISCHARGE:                              OPERATIVE REPORT   PREOPERATIVE DIAGNOSIS:  Left knee end-stage osteoarthritis.  POSTOPERATIVE DIAGNOSIS:  Left knee end-stage osteoarthritis.  PROCEDURE PERFORMED:  Left knee total knee replacement using DePuy Sigma rotating platform prosthesis with MBT revision tray.  ATTENDING SURGEON:  Doran Heater. Veverly Fells, MD  ASSISTANT:  Abbott Pao. Dixon, PAC.  ANESTHESIA:  General anesthesia was used plus femoral block.  ESTIMATED BLOOD LOSS:  Minimal.  FLUID REPLACEMENT:  1200 mL crystalloid.  INSTRUMENT COUNTS:  Correct.  There were no complications.  Perioperative antibiotics were given.  Tourniquet time was 1 hour and 30 minutes at 350 mmHg.  INDICATIONS:  The patient is a 70 year old female with worsening left knee pain and functional loss secondary to end-stage arthritis.  The patient requires assistance for ambulation.  She cannot ambulate more than a block secondary to pain in her knee.  She has clear bone on bone arthritis.  She has failed all measures of conservative management and presents now for a total knee replacement to restore function and eliminate pain.  Informed consent was obtained.  DESCRIPTION OF PROCEDURE:  After an adequate level of anesthesia was achieved, the patient was positioned supine on the operating table. Left leg correctly identified.  A nonsterile tourniquet placed on the left proximal thigh.  The right leg was padded appropriately.  Sterile prep and drape were done of the left knee and we called a time-out.  The left knee was then elevated and exsanguinated using Esmarch bandage.  We elevated the tourniquet to 350 mmHg.  A longitudinal midline incision was  created with the knee in flexion.  A medial parapatellar arthrotomy developed with fresh 10 blade.  Patella everted, lateral patellofemoral ligaments divided.  Distal femur entered using a step-cut drill.  Distal femoral resection using intramedullary guide set 10-mm resection, 5 degree left.  Once we did our distal femoral cut, we sized the femur anterior down using the DePuy sizer noting a size 3.  We went ahead then and drilled our holes and then placed 4 in 1 resection block resecting anterior, posterior, and chamfer cuts.  We then removed ACL, PCL tissue and meniscal tissue.  We then subluxed the tibia anteriorly and cut the tibia 90 degrees perpendicular long axis of tibia with minimal posterior slope with this posterior cruciate substituting prosthesis.  We then went ahead and resected our posterior osteophytes off the femur, checked our gaps at 10 mm and were symmetric.  We then went ahead and finished our tibial resection using the modular drilling keel punch for the MBT revision tray.  We left the trial in place.  We then resected our notch for femoral component using the oscillating saw.  We placed the size 3 left femoral component in place.  We then reduced with a size 10 poly insert trial and then went ahead and resurfaced the patella, starting at 22 mm thickness  going down to 15 mm thickness for a 32 patellar button, which we drilled the lug holes and then placed the button in place.  We ranged the knee, it was nice and stable in flexion and extension.  The patellar tracking was normal with a no-touch technique.  We then removed all trial components, pulse irrigated thoroughly, dried the bone thoroughly, and then went ahead and cemented the components into place. Size 3 MBT revision tibia, size 3 left femur, and then we used a 10 poly insert into place and extension under pressure while we allowed the cement to harden.  We also had the patella cemented as well.  We  then checked for any excess cement, removing the quarter-inch curved osteotome, we removed our trial.  We thought we can get 12 in and then we could.  We placed the real size 12 rotating platform polyethylene insert and reduced the knee.  We were happy with the stability in flexion, extension.  We could come to full extension and patellar tracking was normal.  We thoroughly pulse irrigated the knee and closed the knee in layers with #1 Vicryl suture for the parapatellar arthrotomy followed by 0 Vicryl and 2-0 Vicryl layered closure for subcutaneous tissues and running 4-0 Monocryl for skin.  Steri-Strips and sterile compressive bandage and knee immobilizer applied.  The patient tolerated the procedure well.     Doran Heater. Veverly Fells, M.D.     SRN/MEDQ  D:  08/17/2011  T:  08/17/2011  Job:  [id number]

## 2011-08-17 NOTE — Progress Notes (Signed)
Utilization review completed.  

## 2011-08-17 NOTE — Progress Notes (Signed)
Jennifer from ortho placed pt in cpm at 0-60

## 2011-08-17 NOTE — Interval H&P Note (Signed)
History and Physical Interval Note:   08/17/2011   7:35 AM   Tanya Munoz  has presented today for surgery, with the diagnosis of Left Knee Osteoarthritis  The various methods of treatment have been discussed with the patient and family. After consideration of risks, benefits and other options for treatment, the patient has consented to  Procedure(s): TOTAL KNEE ARTHROPLASTY as a surgical intervention .  The patients' history has been reviewed, patient examined, no change in status, stable for surgery.  I have reviewed the patients' chart and labs.  Questions were answered to the patient's satisfaction.     Augustin Schooling  MD

## 2011-08-17 NOTE — Anesthesia Postprocedure Evaluation (Signed)
  Anesthesia Post-op Note  Patient: Tanya Munoz  Procedure(s) Performed:  TOTAL KNEE ARTHROPLASTY - regional black with general anesthesia.    Patient Location: PACU  Anesthesia Type: General  Level of Consciousness: awake  Airway and Oxygen Therapy: Patient Spontanous Breathing  Post-op Pain: mild  Post-op Assessment: Post-op Vital signs reviewed  Post-op Vital Signs: stable  Complications: No apparent anesthesia complications

## 2011-08-17 NOTE — Progress Notes (Signed)
ANTICOAGULATION CONSULT NOTE - Initial Consult  Pharmacy Consult for Warfarin Indication: VTE prophylaxis  No Known Allergies  Patient Measurements: Weight: 215 lb 9.8 oz (97.8 kg)   Vital Signs: Temp: 98 F (36.7 C) (11/16 1210) Temp src: Oral (11/16 0559) BP: 120/78 mmHg (11/16 1210) Pulse Rate: 74  (11/16 1210)  Labs: Baseline INR 0.92; Hgb 12.2; Hct 38; PLTC 249  Medical History: Past Medical History  Diagnosis Date  . Hypertension     saw a cardiologist in [place] for surgical clearance  . Hyperlipidemia   . Diabetes mellitus     diagnosed 6-7 yrs ago - takes metformin  . Chronic kidney disease     uti about a month ago - treated with abx  . GERD (gastroesophageal reflux disease)     h/o acid reflux - took zantac  . Arthritis     arthritis in knee and shoulder    Medications:  Scheduled:    . aspirin  81 mg Oral Daily  . carvedilol  3.125 mg Oral BID WC  . ceFAZolin (ANCEF) IV  2 g Intravenous Once  . ceFAZolin (ANCEF) IV  2 g Intravenous Q6H  . docusate sodium  100 mg Oral BID  . ferrous sulfate  325 mg Oral TID PC  . hydrochlorothiazide  25 mg Oral Daily  . HYDROmorphone      . ketorolac      . losartan  100 mg Oral Daily  . metFORMIN  850 mg Oral BID WC  . omega-3 acid ethyl esters  1 g Oral Daily  . simvastatin  20 mg Oral q1800  . DISCONTD: celecoxib  200 mg Oral Daily  . DISCONTD: losartan-hydrochlorothiazide  1 tablet Oral Daily    Assessment: 70 yo F s/p left TKA to start warfarin for VTE prophylaxis.  No significant bleeding history noted.  Goal of Therapy:  INR 2-3   Plan:  Coumadin 7.5mg  tonight.  Daily INR.  Coumadin education materials to bedside for review.   Jerline Linzy, Rocky Crafts 08/17/2011,1:05 PM

## 2011-08-17 NOTE — Transfer of Care (Signed)
Immediate Anesthesia Transfer of Care Note  Patient: Tanya Munoz  Procedure(s) Performed:  TOTAL KNEE ARTHROPLASTY - regional black with general anesthesia.    Patient Location: PACU  Anesthesia Type: General  Level of Consciousness: awake, oriented, patient cooperative and responds to stimulation  Airway & Oxygen Therapy: Patient Spontanous Breathing and Patient connected to nasal cannula oxygen  Post-op Assessment: Report given to PACU RN, Post -op Vital signs reviewed and stable and Patient moving all extremities  Post vital signs: Reviewed and stable  Complications: No apparent anesthesia complications

## 2011-08-17 NOTE — H&P (Signed)
NAME:  Tanya Munoz, Tanya Munoz NO.:  [id number]  MEDICAL RECORD NO.:  [id number]  LOCATION:                                 FACILITY:  PHYSICIAN:  Doran Heater. Veverly Fells, M.D. DATE OF BIRTH:  [birth year]  DATE OF ADMISSION: DATE OF DISCHARGE:                             HISTORY & PHYSICAL   CHIEF COMPLAINT:  Left knee pain.  HPI:  The patient is a 70 year old female worsening left knee pain secondary to end-stage osteoarthritis.  The patient elected to have left total knee arthroplasty by Dr. Esmond Plants.  Decrease pain, increase function.  PAST MEDICAL HISTORY: 1. Hypertension. 2. Hyperlipidemia. 3. Diabetes. 4. Osteoarthritis. 5. Recurrent UTIs.  FAMILY MEDICAL HISTORY:  Coronary artery disease and hypertension.  SOCIAL HISTORY:  Patient of Dr. Truman Hayward.  Does not smoke or use alcohol.  DRUG ALLERGIES:  None.  CURRENT MEDICATIONS: 1. Tramadol 50 mg p.o. q.4-6 hours p.r.n. pain. 2. Metformin 850 mg b.i.d. 3. Lovastatin daily. 4. Fish oil. 5. Garlic daily. 6. Losartan/HCT 100/25 mg daily. 7. Carvedilol 3.125 mg b.i.d. 8. Aspirin 81 mg daily.  REVIEW OF SYSTEMS:  Shows recurrent UTIs and pain with ambulation. Otherwise, review of systems is negative.  PHYSICAL EXAM:  VITAL SIGNS:  Pulse 66, respirations 16, blood pressure 138/78. GENERAL:  The patient is a healthy-appearing 70 year old female, in no acute distress.  Pleasant mood and affect.  Alert and oriented x3. HEENT:  His head and neck shows cranial nerves II through XII grossly intact. NECK:  Shows full range of motion in difficulty. CHEST:  Shows active breath sounds bilaterally.  No wheezes, rhonchi, or rales. HEART:  Shows regular rate and rhythm.  No murmur. ABDOMEN:  Nontender, nondistended with active bowel sounds. EXTREMITIES:  Moderate tenderness of the left knee especially the medial joint line with crepitus.  She does have some mild pedal edema bilaterally.  No rashes. NEUROVASCULARLY:  She  is intact.  She does have a normal heel-toe gait.  X-RAYS:  Show end-stage osteoarthritis of the left knee.  IMPRESSION:  End-stage osteoarthritis, left knee.  PLAN OF ACTION:  Left total knee arthroplasty by Dr. Esmond Plants.     Channing Savich B. Kimari Lienhard, P.A.   ______________________________ Doran Heater. Veverly Fells, M.D.    TBD/MEDQ  D:  08/08/2011  T:  08/08/2011  Job:  [id number]

## 2011-08-17 NOTE — Anesthesia Preprocedure Evaluation (Signed)
Anesthesia Evaluation  Patient identified by MRN, date of birth, ID band Patient awake    Reviewed: Allergy & Precautions, H&P , NPO status , Patient's Chart, lab work & pertinent test results, reviewed documented beta blocker date and time   History of Anesthesia Complications Negative for: history of anesthetic complications  Airway Mallampati: II  Neck ROM: Full    Dental  (+) Edentulous Upper and Edentulous Lower   Pulmonary neg pulmonary ROS,  clear to auscultation        Cardiovascular hypertension, Regular Normal    Neuro/Psych Negative Neurological ROS     GI/Hepatic GERD-  ,  Endo/Other  Diabetes mellitus-  Renal/GU      Musculoskeletal   Abdominal   Peds  Hematology   Anesthesia Other Findings   Reproductive/Obstetrics                           Anesthesia Physical Anesthesia Plan  ASA: II  Anesthesia Plan: General   Post-op Pain Management:    Induction: Intravenous  Airway Management Planned: Oral ETT  Additional Equipment:   Intra-op Plan:   Post-operative Plan: Extubation in OR  Informed Consent: I have reviewed the patients History and Physical, chart, labs and discussed the procedure including the risks, benefits and alternatives for the proposed anesthesia with the patient or authorized representative who has indicated his/her understanding and acceptance.     Plan Discussed with: Surgeon  Anesthesia Plan Comments:         Anesthesia Quick Evaluation

## 2011-08-18 LAB — PROTIME-INR
INR: 1.14 (ref 0.00–1.49)
Prothrombin Time: 14.8 seconds (ref 11.6–15.2)

## 2011-08-18 LAB — BASIC METABOLIC PANEL
BUN: 17 mg/dL (ref 6–23)
Calcium: 8.2 mg/dL — ABNORMAL LOW (ref 8.4–10.5)
GFR calc Af Amer: 69 mL/min — ABNORMAL LOW (ref >90)
GFR calc non Af Amer: 60 mL/min — ABNORMAL LOW (ref >90)
Potassium: 3.7 mEq/L (ref 3.5–5.1)
Sodium: 135 mEq/L (ref 135–145)

## 2011-08-18 LAB — CBC
MCH: 28.8 pg (ref 26.0–34.0)
MCHC: 31.8 g/dL (ref 30.0–36.0)
RDW: 13.1 % (ref 11.5–15.5)

## 2011-08-18 LAB — GLUCOSE, CAPILLARY
Glucose-Capillary: 130 mg/dL — ABNORMAL HIGH (ref 70–99)
Glucose-Capillary: 130 mg/dL — ABNORMAL HIGH (ref 70–99)
Glucose-Capillary: 159 mg/dL — ABNORMAL HIGH (ref 70–99)

## 2011-08-18 MED ORDER — WARFARIN SODIUM 7.5 MG PO TABS
7.5000 mg | ORAL_TABLET | Freq: Once | ORAL | Status: AC
  Administered 2011-08-18: 7.5 mg via ORAL
  Filled 2011-08-18: qty 1

## 2011-08-18 NOTE — Progress Notes (Signed)
ANTICOAGULATION CONSULT NOTE - Follow Up Consult  Pharmacy Consult for Coumadin  Indication: VTE prophylaxis  Assessment: 70 yo female s/p L TKA on warfarin for VTE prophylaxis. INR today 1.14, no bleeding complications noted.   Goal of Therapy:  INR 2-3   Plan:  Coumadin 7.5mg  PO x1 today. Check INR in AM.   Leland Her 08/18/2011,7:43 AM   No Known Allergies  Patient Measurements: Weight: 215 lb 9.8 oz (97.8 kg)  Vital Signs: Temp: 99.3 F (37.4 C) (11/17 0518) BP: 106/63 mmHg (11/17 0518) Pulse Rate: 78  (11/17 0518)  Labs:  Basename 08/18/11 0500  HGB 8.7*  HCT 27.4*  PLT 181  APTT --  LABPROT 14.8  INR 1.14  HEPARINUNFRC --  CREATININE 0.93  CKTOTAL --  CKMB --  TROPONINI --   CrCl is unknown because there is no height on file for the current visit.   Medications:  Scheduled:    . aspirin  81 mg Oral Daily  . carvedilol  3.125 mg Oral BID WC  . ceFAZolin (ANCEF) IV  2 g Intravenous Once  . ceFAZolin (ANCEF) IV  2 g Intravenous Q6H  . coumadin book   Does not apply Once  . docusate sodium  100 mg Oral BID  . ferrous sulfate  325 mg Oral TID PC  . hydrochlorothiazide  25 mg Oral Daily  . HYDROmorphone      . ketorolac      . losartan  100 mg Oral Daily  . metFORMIN  850 mg Oral BID WC  . omega-3 acid ethyl esters  1 g Oral Daily  . simvastatin  20 mg Oral q1800  . warfarin  7.5 mg Oral ONCE-1800  . warfarin   Does not apply Once  . DISCONTD: celecoxib  200 mg Oral Daily  . DISCONTD: losartan-hydrochlorothiazide  1 tablet Oral Daily

## 2011-08-18 NOTE — Progress Notes (Signed)
Occupational Therapy Evaluation Patient Details Name: Tanya Munoz MRN: [id number] DOB: Mar 13, 1941 Today's Date: 08/18/2011  Problem List:  Patient Active Problem List  Diagnoses  . Osteoarthrosis, unspecified whether generalized or localized, lower leg    Past Medical History:  Past Medical History  Diagnosis Date  . Hypertension     saw a cardiologist in [place] for surgical clearance  . Hyperlipidemia   . Diabetes mellitus     diagnosed 6-7 yrs ago - takes metformin  . Chronic kidney disease     uti about a month ago - treated with abx  . GERD (gastroesophageal reflux disease)     h/o acid reflux - took zantac  . Arthritis     arthritis in knee and shoulder   Past Surgical History:  Past Surgical History  Procedure Date  . Abdominal hysterectomy     about 35 yrs ago  . Joint replacement     right total knee replacement    OT Assessment/Plan/Recommendation OT Assessment Clinical Impression Statement: Pt will benefit from OT services in the acute care setting to increase  I with ADLs and to increase I with functional transfers in prep for d/c home with son. OT Recommendation/Assessment: Patient will need skilled OT in the acute care venue OT Problem List: Decreased activity tolerance;Decreased knowledge of use of DME or AE;Pain OT Therapy Diagnosis : Acute pain OT Plan OT Frequency: Min 2X/week OT Treatment/Interventions: Self-care/ADL training;DME and/or AE instruction;Therapeutic activities;Patient/family education OT Recommendation Follow Up Recommendations: Home health OT Equipment Recommended: None recommended by OT Individuals Consulted Consulted and Agree with Results and Recommendations: Patient OT Goals Acute Rehab OT Goals OT Goal Formulation: With patient Time For Goal Achievement: 7 days ADL Goals Pt Will Perform Grooming: with modified independence;Standing at sink ADL Goal: Grooming - Progress: Other (comment) Pt Will Perform Lower Body  Bathing: with modified independence;Sit to stand from chair ADL Goal: Lower Body Bathing - Progress: Other (comment) Pt Will Perform Lower Body Dressing: with modified independence;Sit to stand from chair ADL Goal: Lower Body Dressing - Progress: Other (comment) Pt Will Transfer to Toilet: with modified independence;Stand pivot transfer;with DME;3-in-1 ADL Goal: Toilet Transfer - Progress: Other (comment) Pt Will Perform Toileting - Clothing Manipulation: with modified independence;Standing ADL Goal: Toileting - Clothing Manipulation - Progress: Other (comment)  OT Evaluation Precautions/Restrictions  Precautions Required Braces or Orthoses: Yes Knee Immobilizer: On when out of bed or walking;On except when in CPM Restrictions Weight Bearing Restrictions: Yes LLE Weight Bearing: Weight bearing as tolerated Prior Rives Lives With: Spouse;Son [place] Help From: Family Type of Home: House Home Layout: One level Home Access: Stairs to enter Entrance Stairs-Rails: None Entrance Stairs-Number of Steps: 2 Bathroom Shower/Tub: Gaffer;Door [employer] Toilet: Handicapped height Bathroom Accessibility: Yes How Accessible: Accessible via walker Home Adaptive Equipment: None Prior Function Level of Independence: Independent with basic ADLs;Independent with gait;Independent with transfers Able to Take Stairs?: Yes Driving: Yes Vocation: Retired ADL ADL Eating/Feeding: Simulated;Independent Where Assessed - Eating/Feeding: Chair Grooming: Simulated;Independent Where Assessed - Grooming: Sitting, chair Upper Body Bathing: Simulated;Independent Where Assessed - Upper Body Bathing: Sitting, chair Lower Body Bathing: Simulated;Moderate assistance Where Assessed - Lower Body Bathing: Sit to stand from chair Upper Body Dressing: Simulated;Independent Where Assessed - Upper Body Dressing: Sit to stand from chair Lower Body Dressing: Simulated;Moderate  assistance Where Assessed - Lower Body Dressing: Sit to stand from chair Toilet Transfer: Simulated;Minimal assistance Toilet Transfer Method: Stand pivot Toileting - Clothing Manipulation: Simulated;Minimal assistance Toileting - Hygiene:  Simulated;Independent Where Assessed - Toileting Hygiene: Sit on 3-in-1 or toilet Tub/Shower Transfer: Not assessed Tub/Shower Transfer Method: Not assessed Equipment Used: Rolling walker ADL Comments: Pt demonstrates decreased independence with ADLs due to L LE pain. Vision/Perception    Cognition Cognition Arousal/Alertness: Awake/alert Overall Cognitive Status: Appears within functional limits for tasks assessed Orientation Level: Oriented X4 Sensation/Coordination   Extremity Assessment RUE Assessment RUE Assessment: Within Functional Limits LUE Assessment LUE Assessment: Within Functional Limits Mobility  Bed Mobility Bed Mobility: No Supine to Sit: Not tested (comment) Supine to Sit Details (indicate cue type and reason): VC for sequencing and hand placement. Assist with LLE into sitting Sitting - Scoot to Edge of Bed: Not tested (comment) Sitting - Scoot to Edge of Bed Details (indicate cue type and reason): VC for sequencing for assist to edge of bed Transfers Transfers: Yes Sit to Stand: 3: Mod assist;With armrests;From chair/3-in-1 Sit to Stand Details (indicate cue type and reason): VC for hand placement Stand to Sit: 4: Min assist;To chair/3-in-1 Stand to Sit Details: VC for hand placement Exercises   End of Session OT - End of Session Equipment Utilized During Treatment: Gait belt Activity Tolerance: Patient limited by pain Patient left: in chair;with call bell in reach;with family/visitor present General Behavior During Session: [facility] for tasks performed Cognition: [facility] for tasks performed   Darrol Jump 08/18/2011, 12:47 PM  08/18/2011 Darrol Jump OTR/L Pager [phone] Office [phone]

## 2011-08-18 NOTE — Progress Notes (Signed)
Physical Therapy Evaluation Patient Details Name: Tanya Munoz MRN: [id number] DOB: Apr 22, 1941 Today's Date: 08/18/2011  Problem List:  Patient Active Problem List  Diagnoses  . Osteoarthrosis, unspecified whether generalized or localized, lower leg    Past Medical History:  Past Medical History  Diagnosis Date  . Hypertension     saw a cardiologist in [place] for surgical clearance  . Hyperlipidemia   . Diabetes mellitus     diagnosed 6-7 yrs ago - takes metformin  . Chronic kidney disease     uti about a month ago - treated with abx  . GERD (gastroesophageal reflux disease)     h/o acid reflux - took zantac  . Arthritis     arthritis in knee and shoulder   Past Surgical History:  Past Surgical History  Procedure Date  . Abdominal hysterectomy     about 35 yrs ago  . Joint replacement     right total knee replacement    PT Assessment/Plan/Recommendation PT Assessment Clinical Impression Statement: Pt presents with a medical diagnosis of left TKA along with the following impairments/deficits and therapy diagnosis. Pt will benefit from skilled PT in the acute care setting in order to maximize functional mobility for a safe d/c home PT Recommendation/Assessment: Patient will need skilled PT in the acute care venue PT Problem List: Decreased strength;Decreased range of motion;Decreased activity tolerance;Decreased balance;Decreased knowledge of use of DME;Decreased knowledge of precautions;Pain;Decreased mobility PT Therapy Diagnosis : Difficulty walking;Acute pain PT Plan PT Frequency: 7X/week PT Treatment/Interventions: DME instruction;Gait training;Stair training;Functional mobility training;Therapeutic exercise;Balance training;Patient/family education PT Recommendation Follow Up Recommendations: Home health PT Equipment Recommended: Rolling walker with 5" wheels;3 in 1 bedside comode PT Goals  Acute Rehab PT Goals PT Goal Formulation: With patient Time For Goal  Achievement: 7 days Pt will go Supine/Side to Sit: with modified independence PT Goal: Supine/Side to Sit - Progress: Progressing toward goal Pt will go Sit to Supine/Side: with modified independence PT Goal: Sit to Supine/Side - Progress: Progressing toward goal Pt will Transfer Sit to Stand/Stand to Sit: with modified independence PT Transfer Goal: Sit to Stand/Stand to Sit - Progress: Progressing toward goal Pt will Transfer Bed to Chair/Chair to Bed: with modified independence PT Transfer Goal: Bed to Chair/Chair to Bed - Progress: Progressing toward goal Pt will Ambulate: >150 feet;with supervision;with rolling walker PT Goal: Ambulate - Progress: Progressing toward goal Pt will Go Up / Down Stairs: with rolling walker;3-5 stairs;with min assist PT Goal: Up/Down Stairs - Progress: Other (comment) (Unassessed today) Pt will Perform Home Exercise Program: Independently PT Goal: Perform Home Exercise Program - Progress: Progressing toward goal  PT Evaluation Precautions/Restrictions  Precautions Required Braces or Orthoses: Yes Knee Immobilizer: On when out of bed or walking;On except when in CPM Restrictions Weight Bearing Restrictions: Yes LLE Weight Bearing: Weight bearing as tolerated Prior [place] Lives With: Spouse;Son [place] Help From: Family Type of Home: House Home Layout: One level Home Access: Stairs to enter Entrance Stairs-Rails: None Entrance Stairs-Number of Steps: 2 Bathroom Shower/Tub: Gaffer;Door [employer] Toilet: Handicapped height Bathroom Accessibility: Yes How Accessible: Accessible via walker Home Adaptive Equipment: None Prior Function Level of Independence: Independent with basic ADLs;Independent with gait;Independent with transfers Able to Take Stairs?: Yes Driving: Yes Vocation: Retired Artist: Awake/alert Overall Cognitive Status: Appears within functional limits for tasks  assessed Orientation Level: Oriented X4 Sensation/Coordination Sensation Light Touch: Appears Intact Extremity Assessment RLE Assessment RLE Assessment: Within Functional Limits LLE Assessment LLE Assessment: Exceptions  to [facility] LLE AROM (degrees) Overall AROM Left Lower Extremity: Deficits;Due to pain (Hip and Ankle WFL) LLE Strength LLE Overall Strength: Deficits;Due to pain (Hip and Ankle WFL) Mobility (including Balance) Bed Mobility Bed Mobility: Yes Supine to Sit: 4: Min assist Supine to Sit Details (indicate cue type and reason): VC for sequencing and hand placement. Assist with LLE into sitting Sitting - Scoot to Edge of Bed: 5: Supervision Sitting - Scoot to [employer] of Bed Details (indicate cue type and reason): VC for sequencing for assist to edge of bed Transfers Transfers: Yes Sit to Stand: 4: Min assist;From bed Sit to Stand Details (indicate cue type and reason): VC for hand placement Stand to Sit: 4: Min assist;To chair/3-in-1 Stand to Sit Details: VC for hand placement Ambulation/Gait Ambulation/Gait: Yes Ambulation/Gait Assistance: 4: Min assist (Minguard assist) Ambulation/Gait Assistance Details (indicate cue type and reason): VC for sequencing and safety with RW distance Ambulation Distance (Feet): 30 Feet Assistive device: Rolling walker Gait Pattern: Decreased step length - right;Decreased stance time - left;Trunk flexed Gait velocity: Decreased gait speed Stairs: No  Balance Balance Assessed: Yes Static Standing Balance Static Standing - Balance Support: Left upper extremity supported Static Standing - Level of Assistance: 5: Stand by assistance Static Standing - Comment/# of Minutes: 5 minutes while washing face Exercise    End of Session PT - End of Session Equipment Utilized During Treatment: Gait belt;Left knee immobilizer Activity Tolerance: Patient tolerated treatment well Patient left: in chair;with call bell in reach Nurse Communication:  Mobility status for transfers;Mobility status for ambulation General Behavior During Session: [facility] for tasks performed Cognition: [facility] for tasks performed  Ambrose Finland 08/18/2011, 9:17 AM  08/18/2011 Ambrose Finland DPT PAGER: [phone] OFFICE: [phone]

## 2011-08-18 NOTE — Progress Notes (Signed)
Alert and comfortable.  Moves foot well.  Hgb 8.7  James P. Aplington

## 2011-08-19 LAB — CBC
Hemoglobin: 8.5 g/dL — ABNORMAL LOW (ref 12.0–15.0)
Platelets: 183 10*3/uL (ref 150–400)
RBC: 2.96 MIL/uL — ABNORMAL LOW (ref 3.87–5.11)
WBC: 5.6 10*3/uL (ref 4.0–10.5)

## 2011-08-19 LAB — BASIC METABOLIC PANEL
CO2: 27 mEq/L (ref 19–32)
Chloride: 100 mEq/L (ref 96–112)
Glucose, Bld: 126 mg/dL — ABNORMAL HIGH (ref 70–99)
Sodium: 135 mEq/L (ref 135–145)

## 2011-08-19 LAB — PROTIME-INR: Prothrombin Time: 15.4 seconds — ABNORMAL HIGH (ref 11.6–15.2)

## 2011-08-19 MED ORDER — WARFARIN SODIUM 7.5 MG PO TABS
7.5000 mg | ORAL_TABLET | Freq: Once | ORAL | Status: AC
  Administered 2011-08-19: 7.5 mg via ORAL
  Filled 2011-08-19: qty 1

## 2011-08-19 NOTE — Progress Notes (Addendum)
Physical Therapy Treatment Patient Details Name: Tanya Munoz MRN: [id number] DOB: 04/25/1941 Today's Date: 08/19/2011  PT Assessment/Plan  PT - Assessment/Plan Comments on Treatment Session: good progress; ontrack for [employer] tomorrow PT Plan: Discharge plan remains appropriate PT Frequency: 7X/week Follow Up Recommendations: Home health PT Equipment Recommended: Rolling walker with 5" wheels; 3in1 commode PT Goals  Acute Rehab PT Goals PT Goal: Supine/Side to Sit - Progress: Progressing toward goal PT Goal: Sit to Supine/Side - Progress: Progressing toward goal PT Transfer Goal: Sit to Stand/Stand to Sit - Progress: Progressing toward goal PT Transfer Goal: Bed to Chair/Chair to Bed - Progress: Other (comment) PT Goal: Ambulate - Progress: Progressing toward goal PT Goal: Up/Down Stairs - Progress: Met PT Goal: Perform Home Exercise Program - Progress: Other (comment)  PT Treatment Precautions/Restrictions  Precautions Precautions: Knee Required Braces or Orthoses: Yes Knee Immobilizer: On when out of bed or walking;On except when in CPM Restrictions Weight Bearing Restrictions: Yes LLE Weight Bearing: Weight bearing as tolerated Mobility (including Balance) Bed Mobility Supine to Sit: 4: Min assist;With rails;HOB flat;Other (comment) (without physical contact) Supine to Sit Details (indicate cue type and reason): step-by-step cues for greater ease to progress through elbow to hand propping; tried to use sheet roll as leg lifter with lackluster results Transfers Sit to Stand: 5: Supervision;From bed;From chair/3-in-1 Sit to Stand Details (indicate cue type and reason): cues for safe hand placement Stand to Sit: 4: Min assist;To chair/3-in-1 (without physical contact) Stand to Sit Details: cues for hand placement Ambulation/Gait Ambulation/Gait Assistance: 4: Min assist (without physical contact) Ambulation/Gait Assistance Details (indicate cue type and reason): cues for  sequence; pt's husband present and gave appropriate Assist Ambulation Distance (Feet): 75 Feet Assistive device: Rolling walker Gait Pattern: Step-to pattern Gait velocity: slow Stairs: Yes Stairs Assistance: 4: Min assist Stairs Assistance Details (indicate cue type and reason): demo cues for backwards technique Stair Management Technique: No rails;Backwards;With walker (Husband present and gave correct steady assist) Number of Stairs: 2     Exercise    End of Session PT - End of Session Equipment Utilized During Treatment: Gait belt;Left knee immobilizer Activity Tolerance: Patient tolerated treatment well Patient left: in chair;with call bell in reach;with family/visitor present General Behavior During Session: [facility] for tasks performed Cognition: [facility] for tasks performed  Roney Marion Hamff 08/19/2011, 4:53 PM [place], [place]

## 2011-08-19 NOTE — Progress Notes (Signed)
Physical Therapy Treatment Patient Details Name: CHELCEY SHOLTZ MRN: [id number] DOB: 24-Oct-1940 Today's Date: 08/19/2011  PT Assessment/Plan  PT - Assessment/Plan Comments on Treatment Session: good progress; ontrack for [employer] tomorrow; plan for step training and therex next session PT Plan: Discharge plan remains appropriate PT Frequency: 7X/week Follow Up Recommendations: Home health PT Equipment Recommended: Rolling walker with 5" wheels PT Goals  Acute Rehab PT Goals PT Goal: Supine/Side to Sit - Progress: Progressing toward goal PT Goal: Sit to Supine/Side - Progress: Progressing toward goal PT Transfer Goal: Sit to Stand/Stand to Sit - Progress: Progressing toward goal PT Transfer Goal: Bed to Chair/Chair to Bed - Progress: Other (comment) PT Goal: Ambulate - Progress: Progressing toward goal PT Goal: Up/Down Stairs - Progress: Other (comment) PT Goal: Perform Home Exercise Program - Progress: Other (comment)  PT Treatment Precautions/Restrictions  Precautions Precautions: Knee Required Braces or Orthoses: Yes Knee Immobilizer: On when out of bed or walking;On except when in CPM (ambulated today without knee buckle) Restrictions Weight Bearing Restrictions: Yes LLE Weight Bearing: Weight bearing as tolerated Mobility (including Balance) Transfers Sit to Stand: 4: Min assist;From chair/3-in-1;With armrests Sit to Stand Details (indicate cue type and reason): cues for safe technique; optimal positioning Stand to Sit: 4: Min assist;To chair/3-in-1;Other (comment) (2 reps: first to 3in1, then to recliner) Stand to Sit Details: cues for safe hand placement and to control descent Ambulation/Gait Ambulation/Gait Assistance: 4: Min assist (with and without physical contact) Ambulation/Gait Assistance Details (indicate cue type and reason): cues for step length Ambulation Distance (Feet): 80 Feet Assistive device: Rolling walker Gait Pattern: Decreased step length -  right;Decreased stance time - left;Trunk flexed;Antalgic Gait velocity: Decreased gait speed Stairs: No    Exercise  Total Joint Exercises Quad Sets: AROM;Left;10 reps (reclined in recliner) End of Session PT - End of Session Equipment Utilized During Treatment: Gait belt;Left knee immobilizer Activity Tolerance: Patient tolerated treatment well Patient left: in chair;with call bell in reach General Behavior During Session: [facility] for tasks performed Cognition: [facility] for tasks performed  Roney Marion [facility] 08/19/2011, 12:36 PM Roney Marion, [place]

## 2011-08-19 NOTE — Progress Notes (Signed)
  Subjective: 2 Days Post-Op Procedure(s) (LRB): TOTAL KNEE ARTHROPLASTY (Left)   Patient reports pain as moderate.  But tolerating course well.  Pleasant disposition.  Planning on home d/c tom  Objective:   VITALS:   Filed Vitals:   08/19/11 0640  BP: 125/72  Pulse: 87  Temp: 97.5 F (36.4 C)  Resp: 22    Neurovascular intact Incision: dressing C/D/I and dressing changed today Compartment soft  LABS  Basename 08/19/11 0700 08/18/11 0500  HGB 8.5* 8.7*  HCT 27.0* 27.4*  WBC 5.6 5.5  PLT 183 181     Basename 08/19/11 0700 08/18/11 0500  NA 135 135  K 3.9 3.7  BUN 13 17  CREATININE 0.86 0.93  GLUCOSE 126* 125*     Basename 08/19/11 0700 08/18/11 0500  LABPT -- --  INR 1.19 1.14     Assessment/Plan: 2 Days Post-Op Procedure(s) (LRB): TOTAL KNEE ARTHROPLASTY (Left)   Up with therapy D/C IV fluids Plan for discharge tomorrow Discharge home with home health

## 2011-08-19 NOTE — Progress Notes (Signed)
Case Management:   08/19/11 [place], RN, BSN Case Manager [phone] PT has recommended HH PT, rolling walker, physician please order if you agree.  Spoke with pt. concerning [facility] services and pt. stated she wanted to look over list before deciding.  In addition, she wants a 3-N-1 as well.  NCM to follow.

## 2011-08-19 NOTE — Progress Notes (Signed)
ANTICOAGULATION CONSULT NOTE - Follow Up Consult  Pharmacy Consult for Coumadin Indication: VTE prophylaxis  Assessment:  70 yo female s/p L TKA on warfarin for VTE prophylaxis. INR today 1.19, no bleeding complications noted.   Goal of Therapy:  INR 2-3   Plan:  Coumadin 7.5mg  PO x1 today. Check INR in AM.   Tanya Munoz 08/19/2011,8:51 AM  No Known Allergies  Patient Measurements: Weight: 215 lb 9.8 oz (97.8 kg)  Vital Signs: Temp: 97.5 F (36.4 C) (11/18 0640) Temp src: Oral (11/18 0640) BP: 125/72 mmHg (11/18 0640) Pulse Rate: 87  (11/18 0640)  Labs:  Basename 08/19/11 0700 08/18/11 0500  HGB 8.5* 8.7*  HCT 27.0* 27.4*  PLT 183 181  APTT -- --  LABPROT 15.4* 14.8  INR 1.19 1.14  HEPARINUNFRC -- --  CREATININE 0.86 0.93  CKTOTAL -- --  CKMB -- --  TROPONINI -- --   CrCl is unknown because there is no height on file for the current visit.   Medications:  Scheduled:    . aspirin  81 mg Oral Daily  . carvedilol  3.125 mg Oral BID WC  . docusate sodium  100 mg Oral BID  . ferrous sulfate  325 mg Oral TID PC  . hydrochlorothiazide  25 mg Oral Daily  . losartan  100 mg Oral Daily  . metFORMIN  850 mg Oral BID WC  . omega-3 acid ethyl esters  1 g Oral Daily  . simvastatin  20 mg Oral q1800  . warfarin  7.5 mg Oral ONCE-1800  . warfarin   Does not apply Once

## 2011-08-20 LAB — BASIC METABOLIC PANEL
BUN: 17 mg/dL (ref 6–23)
Creatinine, Ser: 0.84 mg/dL (ref 0.50–1.10)
GFR calc non Af Amer: 68 mL/min — ABNORMAL LOW (ref >90)
Glucose, Bld: 128 mg/dL — ABNORMAL HIGH (ref 70–99)
Potassium: 4 mEq/L (ref 3.5–5.1)

## 2011-08-20 LAB — CBC
HCT: 30 % — ABNORMAL LOW (ref 36.0–46.0)
Hemoglobin: 9.5 g/dL — ABNORMAL LOW (ref 12.0–15.0)
MCH: 28.6 pg (ref 26.0–34.0)
MCHC: 31.7 g/dL (ref 30.0–36.0)
MCV: 90.4 fL (ref 78.0–100.0)

## 2011-08-20 LAB — GLUCOSE, CAPILLARY: Glucose-Capillary: 132 mg/dL — ABNORMAL HIGH (ref 70–99)

## 2011-08-20 LAB — PROTIME-INR: INR: 1.45 (ref 0.00–1.49)

## 2011-08-20 MED ORDER — POLYETHYLENE GLYCOL 3350 17 G PO PACK: 17.0000 g | PACK | Freq: Every day | ORAL | Status: AC | PRN

## 2011-08-20 MED ORDER — WARFARIN SODIUM 7.5 MG PO TABS
7.5000 mg | ORAL_TABLET | Freq: Every day | ORAL | Status: DC
  Filled 2011-08-20: qty 1

## 2011-08-20 MED ORDER — BISACODYL 5 MG PO TBEC: 5.0000 mg | DELAYED_RELEASE_TABLET | Freq: Every day | ORAL | Status: AC | PRN

## 2011-08-20 MED ORDER — METHOCARBAMOL 500 MG PO TABS: 500.0000 mg | ORAL_TABLET | Freq: Four times a day (QID) | ORAL | Status: AC | PRN

## 2011-08-20 NOTE — Progress Notes (Signed)
Patient was initially setup with [facility], for RN/PT. They do not have contract with [employer]. [facility] doesn't go to [facility]. Case Manager contacted 5 different home health Agencies that either don't go to [employer] or [employer] with [facility]. [employer] [facility] [place] will provide home health RN and PT, the first available day is Wednesday 08/22/11 for RN and PT to begin. Will notify Dr. Veverly Fells.Patient's PCP- Dr. Cher Nakai in [place] will monitor coumadin. Contacted his office and spoke with Butch Penny.

## 2011-08-20 NOTE — Progress Notes (Signed)
ANTICOAGULATION CONSULT NOTE - Follow Up Consult  Pharmacy Consult for coumadin Indication: VTE prophylaxis s/p L TKA No Known Allergies  Patient Measurements: Weight: 215 lb 9.8 oz (97.8 kg)  Labs:  Basename 08/20/11 0610 08/19/11 0700 08/18/11 0500  HGB 9.5* 8.5* --  HCT 30.0* 27.0* 27.4*  PLT 240 183 181  APTT -- -- --  LABPROT 17.9* 15.4* 14.8  INR 1.45 1.19 1.14  HEPARINUNFRC -- -- --  CREATININE 0.84 0.86 0.93  CKTOTAL -- -- --  CKMB -- -- --  TROPONINI -- -- --  Assessment: INR 1.45. No bleeding noted   Goal of Therapy:  INR 2-3   Plan:  rec dc home with rx for 5mg  tablets - take 1.5 tablets = 7.5mg  daily f/u w [facility] RN for INR goal 2-3.   Education done 08/19/11.   Leodis Sias T 08/20/2011,8:56 AM

## 2011-08-20 NOTE — Progress Notes (Signed)
Orthopedics Progress Note  Subjective: Pt doing well with minimal pain to right knee. Walking fairly well with PT and is ready for discharge home  Objective:  Filed Vitals:   08/20/11 0644  BP: 130/47  Pulse: 91  Temp: 97.9 F (36.6 C)  Resp: 18    General: Awake and alert  Musculoskeletal: Left knee with well appearing wound, incision healing with no erythema or drainage Neurovascularly intact distally  Lab Results  Component Value Date   WBC 6.4 08/20/2011   HGB 9.5* 08/20/2011   HCT 30.0* 08/20/2011   MCV 90.4 08/20/2011   PLT 240 08/20/2011       Component Value Date/Time   NA 134* 08/20/2011 0610   K 4.0 08/20/2011 0610   CL 98 08/20/2011 0610   CO2 26 08/20/2011 0610   GLUCOSE 128* 08/20/2011 0610   BUN 17 08/20/2011 0610   CREATININE 0.84 08/20/2011 0610   CALCIUM 9.1 08/20/2011 0610   GFRNONAA 68* 08/20/2011 0610   GFRAA 79* 08/20/2011 0610    Lab Results  Component Value Date   INR 1.45 08/20/2011   INR 1.19 08/19/2011   INR 1.14 08/18/2011    Assessment/Plan: POD #3 s/p Procedure(s): TOTAL KNEE ARTHROPLASTY Plan to discharge home today. F/u in 2 weeks  Doran Heater. Veverly Fells, MD 08/20/2011 9:25 AM

## 2011-08-20 NOTE — Progress Notes (Signed)
Physical Therapy Treatment Patient Details Name: Tanya Munoz MRN: [id number] DOB: 11/05/1940 Today's Date: 08/20/2011  PT Assessment/Plan  PT - Assessment/Plan Comments on Treatment Session: Good Left knee contro in stance; should be able to dc home today; if able to see pt for a BID session before she discharges, will go over therex PT Plan: Discharge plan remains appropriate PT Frequency: 7X/week Follow Up Recommendations: Home health PT Equipment Recommended: Rolling walker with 5" wheels, 3in1 (if pt doesn't already have) PT Goals  Acute Rehab PT Goals Pt will go Supine/Side to Sit: with modified independence PT Goal: Supine/Side to Sit - Progress: Other (comment) Pt will go Sit to Supine/Side: with modified independence PT Goal: Sit to Supine/Side - Progress: Other (comment) Pt will Transfer Sit to Stand/Stand to Sit: with modified independence PT Transfer Goal: Sit to Stand/Stand to Sit - Progress: Progressing toward goal Pt will Transfer Bed to Chair/Chair to Bed: with modified independence PT Transfer Goal: Bed to Chair/Chair to Bed - Progress: Other (comment) Pt will Ambulate: >150 feet;with supervision;with rolling walker PT Goal: Ambulate - Progress: Progressing toward goal Pt will Go Up / Down Stairs: with rolling walker;3-5 stairs;with min assist PT Goal: Up/Down Stairs - Progress: Met Pt will Perform Home Exercise Program: Independently PT Goal: Perform Home Exercise Program - Progress: Other (comment)  PT Treatment Precautions/Restrictions  Precautions Precautions: Knee Required Braces or Orthoses: Yes Knee Immobilizer: Other (comment) (No knee buckling with amb today;told pt to use KI with steps) Restrictions Weight Bearing Restrictions: Yes LLE Weight Bearing: Weight bearing as tolerated Mobility (including Balance) Bed Mobility Bed Mobility: No Supine to Sit: Not tested (comment) Sitting - Scoot to Edge of Bed: Not tested (comment) Transfers Sit to  Stand: 5: Supervision;From chair/3-in-1;With armrests Sit to Stand Details (indicate cue type and reason): good hand placement Stand to Sit: 5: Supervision;To chair/3-in-1;With armrests Stand to Sit Details: cues to control descent Ambulation/Gait Ambulation/Gait Assistance: 5: Supervision Ambulation/Gait Assistance Details (indicate cue type and reason): amb without KI with no knee buckling noted; cued to activate quad for Left stance stability Ambulation Distance (Feet): 110 Feet Assistive device: Rolling walker Stairs: Yes Stairs Assistance: 4: Min assist Stairs Assistance Details (indicate cue type and reason): had pt cue therapist in safe technique for RW management upsteps backwards, so that pt can cue her husband Stair Management Technique: No rails;Backwards;With walker Number of Stairs: 2     Exercise   Prioritized gait, steps in prep for dc home, plan to address therex in next session if pt doesn't already dc End of Session PT - End of Session Equipment Utilized During Treatment: Gait belt Activity Tolerance: Patient tolerated treatment well Patient left: in chair;with call bell in reach General Behavior During Session: [facility] for tasks performed Cognition: [facility] for tasks performed  Roney Marion Hamff 08/20/2011, 12:26 PM Roney Marion, [place]

## 2011-08-20 NOTE — Progress Notes (Signed)
Occupational Therapy Treatment Patient Details Name: Tanya Munoz MRN: [id number] DOB: 1941/05/23 Today's Date: 08/20/2011  OT Assessment/Plan OT Assessment/Plan Comments on Treatment Session: Pt eager to participate in therapy. Progressing well towards goals. OT Plan: Discharge plan remains appropriate OT Frequency: Min 2X/week Follow Up Recommendations: Home health OT Equipment Recommended: Rolling walker with 5" wheels OT Goals Acute Rehab OT Goals OT Goal Formulation: With patient Time For Goal Achievement: 7 days ADL Goals Pt Will Perform Grooming: with modified independence;Standing at sink ADL Goal: Grooming - Progress: Not addressed Pt Will Perform Lower Body Bathing: with modified independence;Sit to stand from chair ADL Goal: Lower Body Bathing - Progress: Progressing toward goals Pt Will Perform Lower Body Dressing: with modified independence;Sit to stand from chair ADL Goal: Lower Body Dressing - Progress: Progressing toward goals Pt Will Transfer to Toilet: with modified independence;Stand pivot transfer;with DME;3-in-1 ADL Goal: Toilet Transfer - Progress: Not addressed Pt Will Perform Toileting - Clothing Manipulation: with modified independence;Standing ADL Goal: Toileting - Clothing Manipulation - Progress: Progressing toward goals  OT Treatment Precautions/Restrictions  Precautions Precautions: Knee Restrictions Weight Bearing Restrictions: Yes LLE Weight Bearing: Weight bearing as tolerated   ADL ADL Upper Body Dressing: Performed;Independent Where Assessed - Upper Body Dressing: Standing Lower Body Dressing: Performed;Minimal assistance Lower Body Dressing Details (indicate cue type and reason): Min assist to get sock of L heel. Where Assessed - Lower Body Dressing: Sit to stand from chair Equipment Used: Rolling walker ADL Comments: Pt demonstrates increased I with ADLs since evaluation.  Pt progressing very well. Mobility  Bed Mobility Bed  Mobility: No Supine to Sit: Not tested (comment) Sitting - Scoot to Edge of Bed: Not tested (comment) Transfers Transfers: Yes Sit to Stand: 5: Supervision;From bed;From chair/3-in-1 Sit to Stand Details (indicate cue type and reason): cues for hand placement. Stand to Sit: 5: Supervision;To chair/3-in-1;With armrests Stand to Sit Details: cues for hand placement Exercises    End of Session General Behavior During Session: [facility] for tasks performed Cognition: [facility] for tasks performed  Darrol Jump  08/20/2011, 11:34 AM 08/20/2011 Darrol Jump OTR/L Pager [phone] Office [phone]

## 2011-08-20 NOTE — Progress Notes (Signed)
Physical Therapy Treatment Patient Details Name: Tanya Munoz MRN: [id number] DOB: [birth year] Today's Date: 08/20/2011  PT Assessment/Plan  PT - Assessment/Plan Comments on Treatment Session: Focused this session on reviewing HEP. Husband present throughout. Pt verbalized understanding and reviewed with HEP in hand.  PT Plan: Discharge plan remains appropriate PT Frequency: 7X/week Follow Up Recommendations: Home health PT Equipment Recommended: Rolling walker with 5" wheels PT Goals  Acute Rehab PT Goals Pt will go Supine/Side to Sit: with modified independence PT Goal: Supine/Side to Sit - Progress: Other (comment) Pt will go Sit to Supine/Side: with modified independence PT Goal: Sit to Supine/Side - Progress: Other (comment) Pt will Transfer Sit to Stand/Stand to Sit: with modified independence PT Transfer Goal: Sit to Stand/Stand to Sit - Progress: Progressing toward goal Pt will Transfer Bed to Chair/Chair to Bed: with modified independence PT Transfer Goal: Bed to Chair/Chair to Bed - Progress: Progressing toward goal Pt will Ambulate: >150 feet;with supervision;with rolling walker PT Goal: Ambulate - Progress: Progressing toward goal Pt will Go Up / Down Stairs: with rolling walker;3-5 stairs;with min assist PT Goal: Up/Down Stairs - Progress: Met Pt will Perform Home Exercise Program: Independently PT Goal: Perform Home Exercise Program - Progress: Progressing toward goal  PT Treatment Precautions/Restrictions  Precautions Precautions: Knee Required Braces or Orthoses: Yes Knee Immobilizer: Other (comment) (No knee buckling with amb today;told pt to use KI with steps) Restrictions Weight Bearing Restrictions: Yes LLE Weight Bearing: Weight bearing as tolerated Mobility (including Balance) Bed Mobility Bed Mobility: No Supine to Sit: Not tested (comment) Sitting - Scoot to Edge of Bed: Not tested (comment) Transfers Sit to Stand: 5: Supervision;From  chair/3-in-1 Stand to Sit: 5: Supervision;To chair/3-in-1 Ambulation/Gait Ambulation/Gait: Yes Ambulation/Gait Assistance: 5: Supervision Ambulation/Gait Assistance Details (indicate cue type and reason): amb with no knee immobilizer. No eveidence of buckling Ambulation Distance (Feet): 75 Feet Assistive device: Rolling walker Gait Pattern: Step-to pattern Stairs: No   Exercise  Total Joint Exercises Quad Sets: AROM;Strengthening;Left;15 reps;Seated Short Arc Quad: AAROM;Strengthening;Left;Other reps (comment);Seated (15) Heel Slides: Seated;15 reps;Left;Strengthening;AAROM Hip ABduction/ADduction: AAROM;Strengthening;Left;15 reps;Seated Straight Leg Raises: Strengthening;AAROM;Left;15 reps;Seated End of Session PT - End of Session Equipment Utilized During Treatment: Gait belt Activity Tolerance: Patient tolerated treatment well Patient left: in chair;with call bell in reach;with family/visitor present General Behavior During Session: [facility] for tasks performed Cognition: [facility] for tasks performed  Tahjae Clausing, Tonia Brooms 08/20/2011, 2:25 PM 08/20/2011 Jacqualyn Posey PTA [phone] pager [phone] office

## 2011-08-20 NOTE — Progress Notes (Signed)
Patient has been setup for [place] and PT thru [facility], [phone]. Equipment to be delivered from [facility].

## 2011-08-21 ENCOUNTER — Encounter (HOSPITAL_COMMUNITY): Payer: Self-pay | Admitting: Orthopedic Surgery

## 2011-08-21 NOTE — Progress Notes (Signed)
Patient contacted hospital out of concern of no equipment delivery or PT. Evidently [facility] [phone]) is handling the DME but did not receive an order (per Olivia Mackie). According to hospital records, no orders were written. I contacted Merla Riches PA (with Dr. Veverly Fells at [facility] [phone]) who readily agreed to fax the DME orders with a facesheet to [employer] (fax # [phone]). I contacted patient and explained that the RN and PT will not start until tomorrow (Wed. 11/21) per notes in chart. I also explained the delay in her medical equipment. Patient was appreciative of the update. She stated she is doing well, her husband won't let her do too much.

## 2012-04-16 IMAGING — CR DG CHEST 2V
2 series · 2 of 2 positions shown · non-contrast
Comparison: Chest radiograph performed 12/13/2003

CLINICAL DATA: Preoperative chest radiograph for left total knee
replacement.

CHEST - 2 VIEW

[view not recorded (1 of 2)]
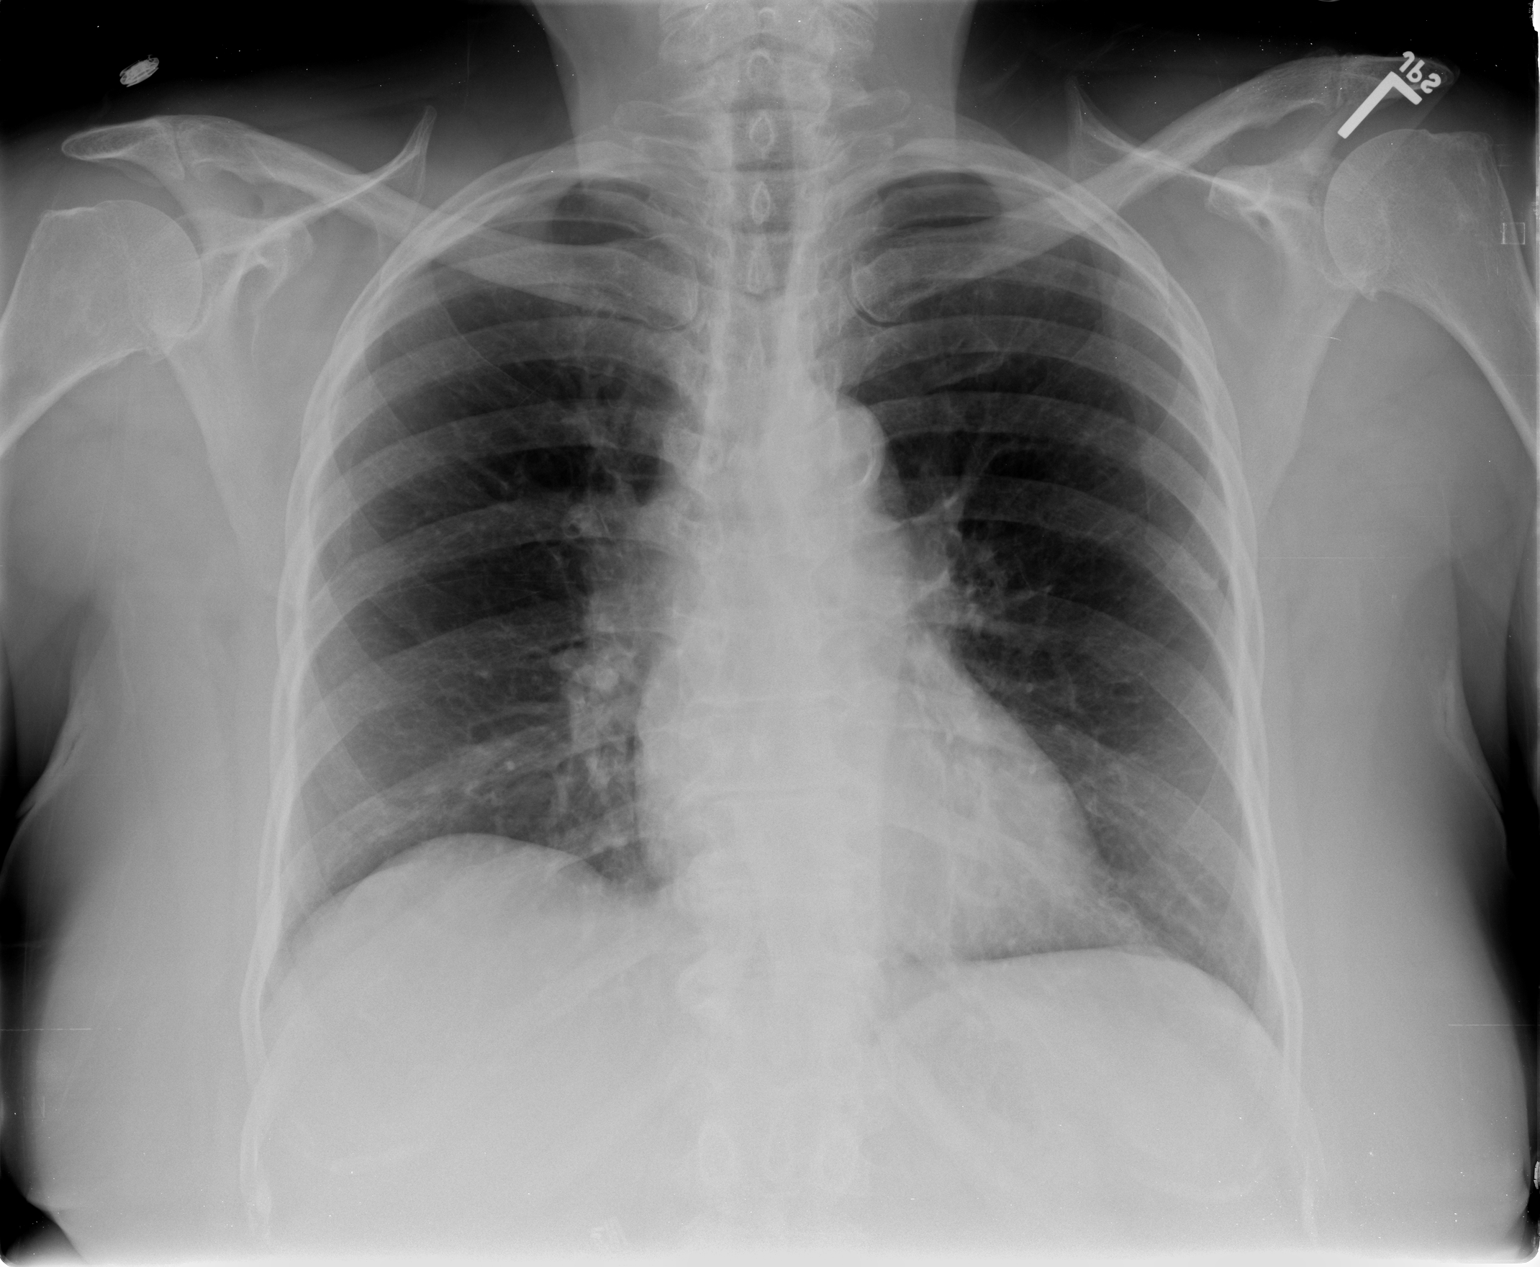

[view not recorded (2 of 2)]
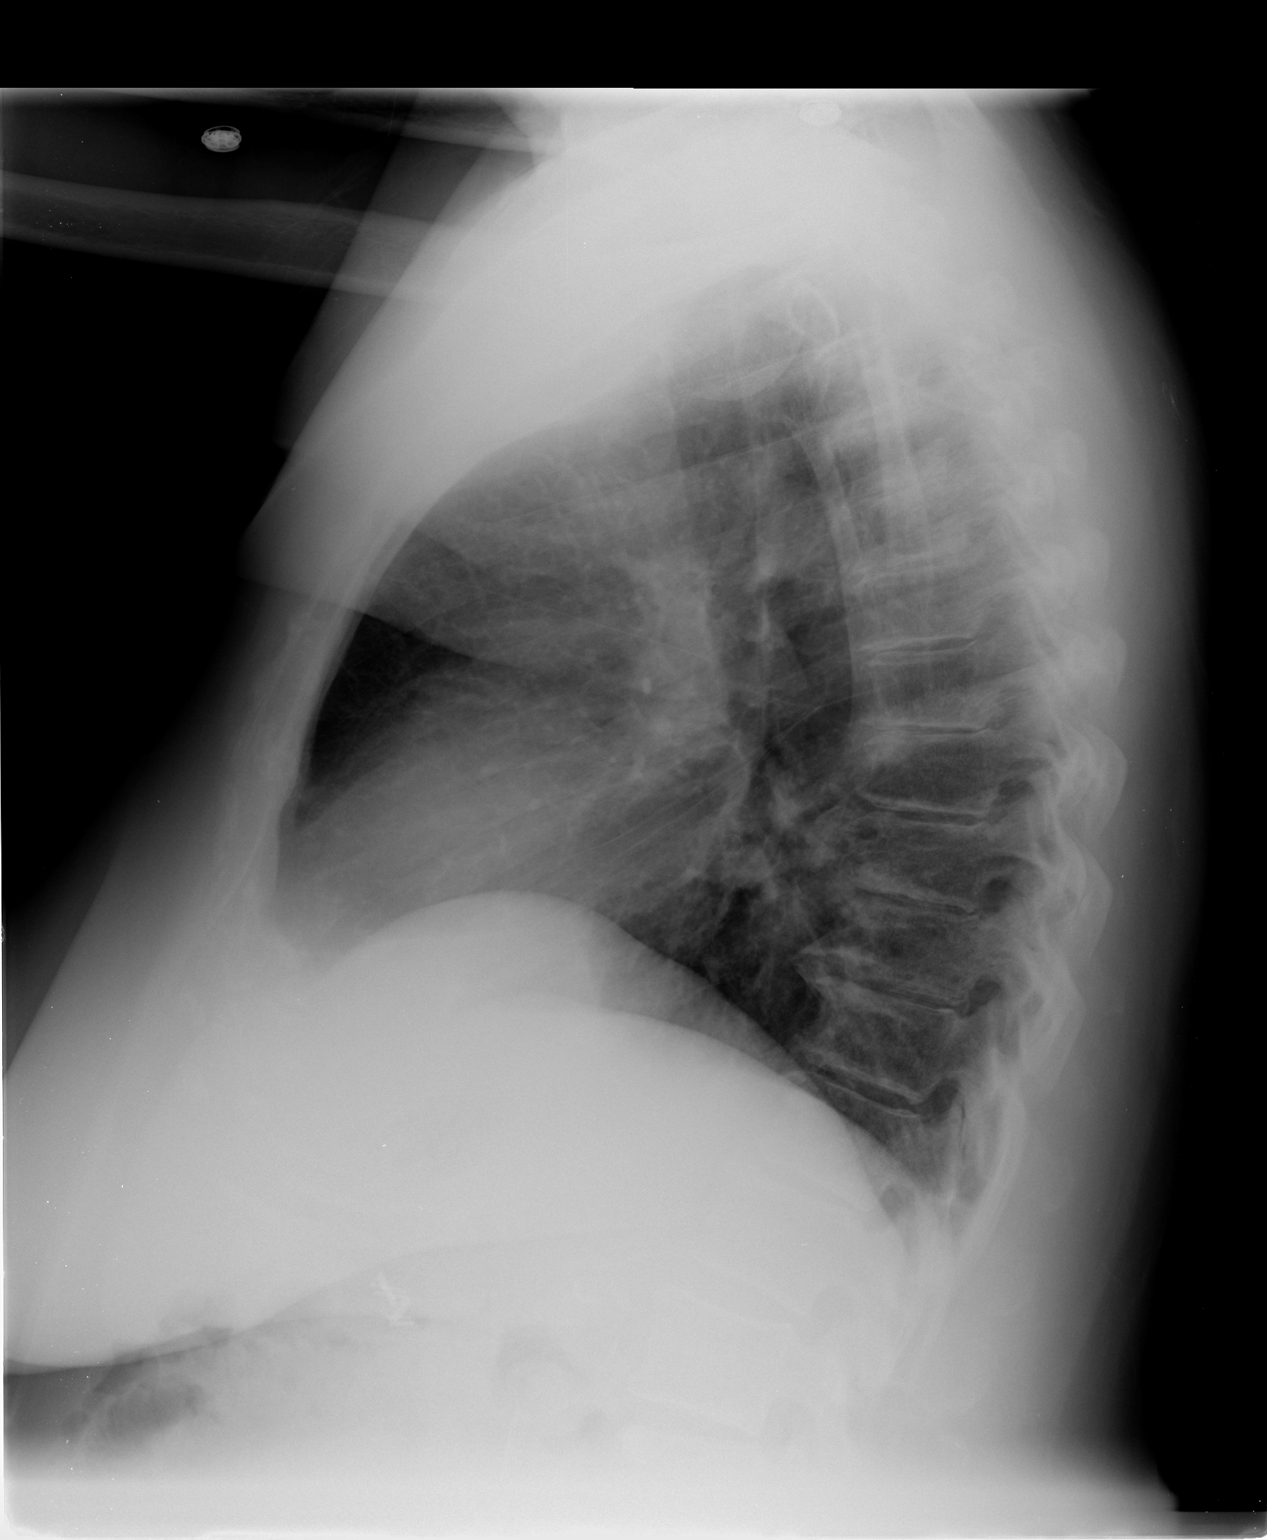

[2 of 2 positions shown; findings below may reference images not displayed]

FINDINGS: The lungs are well-aerated and clear.  There is no
evidence of focal opacification, pleural effusion or pneumothorax.

The heart is normal in size; calcification is noted within the
aortic arch.  No acute osseous abnormalities are seen.  Clips are
noted within the right upper quadrant, reflecting prior
cholecystectomy.
IMPRESSION: No acute cardiopulmonary process seen.

## 2015-07-06 DIAGNOSIS — M4726 Other spondylosis with radiculopathy, lumbar region: Secondary | ICD-10-CM | POA: Diagnosis not present

## 2015-07-06 DIAGNOSIS — M4317 Spondylolisthesis, lumbosacral region: Secondary | ICD-10-CM | POA: Diagnosis not present

## 2015-07-06 DIAGNOSIS — M47898 Other spondylosis, sacral and sacrococcygeal region: Secondary | ICD-10-CM | POA: Diagnosis not present

## 2015-07-06 DIAGNOSIS — M9905 Segmental and somatic dysfunction of pelvic region: Secondary | ICD-10-CM | POA: Diagnosis not present

## 2015-07-06 DIAGNOSIS — M9904 Segmental and somatic dysfunction of sacral region: Secondary | ICD-10-CM | POA: Diagnosis not present

## 2015-07-06 DIAGNOSIS — M9903 Segmental and somatic dysfunction of lumbar region: Secondary | ICD-10-CM | POA: Diagnosis not present

## 2015-07-15 DIAGNOSIS — I10 Essential (primary) hypertension: Secondary | ICD-10-CM | POA: Diagnosis not present

## 2015-07-15 DIAGNOSIS — M159 Polyosteoarthritis, unspecified: Secondary | ICD-10-CM | POA: Diagnosis not present

## 2015-07-15 DIAGNOSIS — N183 Chronic kidney disease, stage 3 (moderate): Secondary | ICD-10-CM | POA: Diagnosis not present

## 2015-07-15 DIAGNOSIS — Z23 Encounter for immunization: Secondary | ICD-10-CM | POA: Diagnosis not present

## 2015-07-15 DIAGNOSIS — Z6838 Body mass index (BMI) 38.0-38.9, adult: Secondary | ICD-10-CM | POA: Diagnosis not present

## 2015-07-15 DIAGNOSIS — E119 Type 2 diabetes mellitus without complications: Secondary | ICD-10-CM | POA: Diagnosis not present

## 2015-07-15 DIAGNOSIS — E7800 Pure hypercholesterolemia, unspecified: Secondary | ICD-10-CM | POA: Diagnosis not present

## 2015-07-15 DIAGNOSIS — E669 Obesity, unspecified: Secondary | ICD-10-CM | POA: Diagnosis not present

## 2015-07-15 DIAGNOSIS — K219 Gastro-esophageal reflux disease without esophagitis: Secondary | ICD-10-CM | POA: Diagnosis not present

## 2015-07-15 DIAGNOSIS — J45909 Unspecified asthma, uncomplicated: Secondary | ICD-10-CM | POA: Diagnosis not present

## 2015-07-22 DIAGNOSIS — M4317 Spondylolisthesis, lumbosacral region: Secondary | ICD-10-CM | POA: Diagnosis not present

## 2015-07-22 DIAGNOSIS — M4726 Other spondylosis with radiculopathy, lumbar region: Secondary | ICD-10-CM | POA: Diagnosis not present

## 2015-07-22 DIAGNOSIS — M9903 Segmental and somatic dysfunction of lumbar region: Secondary | ICD-10-CM | POA: Diagnosis not present

## 2015-07-22 DIAGNOSIS — M9904 Segmental and somatic dysfunction of sacral region: Secondary | ICD-10-CM | POA: Diagnosis not present

## 2015-07-22 DIAGNOSIS — M9905 Segmental and somatic dysfunction of pelvic region: Secondary | ICD-10-CM | POA: Diagnosis not present

## 2015-07-22 DIAGNOSIS — M47898 Other spondylosis, sacral and sacrococcygeal region: Secondary | ICD-10-CM | POA: Diagnosis not present

## 2015-08-19 DIAGNOSIS — E669 Obesity, unspecified: Secondary | ICD-10-CM | POA: Diagnosis not present

## 2015-08-19 DIAGNOSIS — E119 Type 2 diabetes mellitus without complications: Secondary | ICD-10-CM | POA: Diagnosis not present

## 2015-08-19 DIAGNOSIS — Z1389 Encounter for screening for other disorder: Secondary | ICD-10-CM | POA: Diagnosis not present

## 2015-08-19 DIAGNOSIS — K219 Gastro-esophageal reflux disease without esophagitis: Secondary | ICD-10-CM | POA: Diagnosis not present

## 2015-08-19 DIAGNOSIS — N183 Chronic kidney disease, stage 3 (moderate): Secondary | ICD-10-CM | POA: Diagnosis not present

## 2015-08-19 DIAGNOSIS — J45909 Unspecified asthma, uncomplicated: Secondary | ICD-10-CM | POA: Diagnosis not present

## 2015-08-19 DIAGNOSIS — Z6838 Body mass index (BMI) 38.0-38.9, adult: Secondary | ICD-10-CM | POA: Diagnosis not present

## 2015-08-19 DIAGNOSIS — I10 Essential (primary) hypertension: Secondary | ICD-10-CM | POA: Diagnosis not present

## 2015-08-19 DIAGNOSIS — M159 Polyosteoarthritis, unspecified: Secondary | ICD-10-CM | POA: Diagnosis not present

## 2015-08-19 DIAGNOSIS — E7800 Pure hypercholesterolemia, unspecified: Secondary | ICD-10-CM | POA: Diagnosis not present

## 2015-08-24 DIAGNOSIS — M9903 Segmental and somatic dysfunction of lumbar region: Secondary | ICD-10-CM | POA: Diagnosis not present

## 2015-08-24 DIAGNOSIS — M4317 Spondylolisthesis, lumbosacral region: Secondary | ICD-10-CM | POA: Diagnosis not present

## 2015-08-24 DIAGNOSIS — M9904 Segmental and somatic dysfunction of sacral region: Secondary | ICD-10-CM | POA: Diagnosis not present

## 2015-08-24 DIAGNOSIS — M9905 Segmental and somatic dysfunction of pelvic region: Secondary | ICD-10-CM | POA: Diagnosis not present

## 2015-08-24 DIAGNOSIS — M47897 Other spondylosis, lumbosacral region: Secondary | ICD-10-CM | POA: Diagnosis not present

## 2015-08-24 DIAGNOSIS — R69 Illness, unspecified: Secondary | ICD-10-CM | POA: Diagnosis not present

## 2015-08-24 DIAGNOSIS — M4806 Spinal stenosis, lumbar region: Secondary | ICD-10-CM | POA: Diagnosis not present

## 2015-09-01 DIAGNOSIS — I10 Essential (primary) hypertension: Secondary | ICD-10-CM | POA: Diagnosis not present

## 2015-09-01 DIAGNOSIS — K219 Gastro-esophageal reflux disease without esophagitis: Secondary | ICD-10-CM | POA: Diagnosis not present

## 2015-09-01 DIAGNOSIS — E119 Type 2 diabetes mellitus without complications: Secondary | ICD-10-CM | POA: Diagnosis not present

## 2015-09-01 DIAGNOSIS — E784 Other hyperlipidemia: Secondary | ICD-10-CM | POA: Diagnosis not present

## 2015-09-05 DIAGNOSIS — R69 Illness, unspecified: Secondary | ICD-10-CM | POA: Diagnosis not present

## 2015-09-13 DIAGNOSIS — E119 Type 2 diabetes mellitus without complications: Secondary | ICD-10-CM | POA: Diagnosis not present

## 2015-09-19 DIAGNOSIS — Z0100 Encounter for examination of eyes and vision without abnormal findings: Secondary | ICD-10-CM | POA: Diagnosis not present

## 2015-09-23 DIAGNOSIS — R69 Illness, unspecified: Secondary | ICD-10-CM | POA: Diagnosis not present

## 2015-09-28 DIAGNOSIS — N183 Chronic kidney disease, stage 3 (moderate): Secondary | ICD-10-CM | POA: Diagnosis not present

## 2015-09-28 DIAGNOSIS — E119 Type 2 diabetes mellitus without complications: Secondary | ICD-10-CM | POA: Diagnosis not present

## 2015-09-28 DIAGNOSIS — Z6837 Body mass index (BMI) 37.0-37.9, adult: Secondary | ICD-10-CM | POA: Diagnosis not present

## 2015-09-28 DIAGNOSIS — E669 Obesity, unspecified: Secondary | ICD-10-CM | POA: Diagnosis not present

## 2015-09-28 DIAGNOSIS — J45909 Unspecified asthma, uncomplicated: Secondary | ICD-10-CM | POA: Diagnosis not present

## 2015-09-28 DIAGNOSIS — K219 Gastro-esophageal reflux disease without esophagitis: Secondary | ICD-10-CM | POA: Diagnosis not present

## 2015-09-28 DIAGNOSIS — M159 Polyosteoarthritis, unspecified: Secondary | ICD-10-CM | POA: Diagnosis not present

## 2015-09-28 DIAGNOSIS — E7800 Pure hypercholesterolemia, unspecified: Secondary | ICD-10-CM | POA: Diagnosis not present

## 2015-09-28 DIAGNOSIS — I10 Essential (primary) hypertension: Secondary | ICD-10-CM | POA: Diagnosis not present

## 2015-10-04 DIAGNOSIS — Z1231 Encounter for screening mammogram for malignant neoplasm of breast: Secondary | ICD-10-CM | POA: Diagnosis not present

## 2015-10-05 DIAGNOSIS — M9904 Segmental and somatic dysfunction of sacral region: Secondary | ICD-10-CM | POA: Diagnosis not present

## 2015-10-05 DIAGNOSIS — M9903 Segmental and somatic dysfunction of lumbar region: Secondary | ICD-10-CM | POA: Diagnosis not present

## 2015-10-05 DIAGNOSIS — M4728 Other spondylosis with radiculopathy, sacral and sacrococcygeal region: Secondary | ICD-10-CM | POA: Diagnosis not present

## 2015-10-05 DIAGNOSIS — M9905 Segmental and somatic dysfunction of pelvic region: Secondary | ICD-10-CM | POA: Diagnosis not present

## 2015-10-05 DIAGNOSIS — M5432 Sciatica, left side: Secondary | ICD-10-CM | POA: Diagnosis not present

## 2015-10-05 DIAGNOSIS — M5116 Intervertebral disc disorders with radiculopathy, lumbar region: Secondary | ICD-10-CM | POA: Diagnosis not present

## 2015-11-02 DIAGNOSIS — M5432 Sciatica, left side: Secondary | ICD-10-CM | POA: Diagnosis not present

## 2015-11-02 DIAGNOSIS — M9905 Segmental and somatic dysfunction of pelvic region: Secondary | ICD-10-CM | POA: Diagnosis not present

## 2015-11-02 DIAGNOSIS — M9904 Segmental and somatic dysfunction of sacral region: Secondary | ICD-10-CM | POA: Diagnosis not present

## 2015-11-02 DIAGNOSIS — M4728 Other spondylosis with radiculopathy, sacral and sacrococcygeal region: Secondary | ICD-10-CM | POA: Diagnosis not present

## 2015-11-02 DIAGNOSIS — M5116 Intervertebral disc disorders with radiculopathy, lumbar region: Secondary | ICD-10-CM | POA: Diagnosis not present

## 2015-11-02 DIAGNOSIS — M9903 Segmental and somatic dysfunction of lumbar region: Secondary | ICD-10-CM | POA: Diagnosis not present

## 2015-11-23 DIAGNOSIS — M5116 Intervertebral disc disorders with radiculopathy, lumbar region: Secondary | ICD-10-CM | POA: Diagnosis not present

## 2015-11-23 DIAGNOSIS — M9904 Segmental and somatic dysfunction of sacral region: Secondary | ICD-10-CM | POA: Diagnosis not present

## 2015-11-23 DIAGNOSIS — M4728 Other spondylosis with radiculopathy, sacral and sacrococcygeal region: Secondary | ICD-10-CM | POA: Diagnosis not present

## 2015-11-23 DIAGNOSIS — M9903 Segmental and somatic dysfunction of lumbar region: Secondary | ICD-10-CM | POA: Diagnosis not present

## 2015-12-13 DIAGNOSIS — N183 Chronic kidney disease, stage 3 (moderate): Secondary | ICD-10-CM | POA: Diagnosis not present

## 2015-12-13 DIAGNOSIS — J45909 Unspecified asthma, uncomplicated: Secondary | ICD-10-CM | POA: Diagnosis not present

## 2015-12-13 DIAGNOSIS — Z6837 Body mass index (BMI) 37.0-37.9, adult: Secondary | ICD-10-CM | POA: Diagnosis not present

## 2015-12-13 DIAGNOSIS — E7800 Pure hypercholesterolemia, unspecified: Secondary | ICD-10-CM | POA: Diagnosis not present

## 2015-12-13 DIAGNOSIS — K219 Gastro-esophageal reflux disease without esophagitis: Secondary | ICD-10-CM | POA: Diagnosis not present

## 2015-12-13 DIAGNOSIS — E119 Type 2 diabetes mellitus without complications: Secondary | ICD-10-CM | POA: Diagnosis not present

## 2015-12-13 DIAGNOSIS — M159 Polyosteoarthritis, unspecified: Secondary | ICD-10-CM | POA: Diagnosis not present

## 2015-12-13 DIAGNOSIS — I10 Essential (primary) hypertension: Secondary | ICD-10-CM | POA: Diagnosis not present

## 2015-12-14 DIAGNOSIS — E119 Type 2 diabetes mellitus without complications: Secondary | ICD-10-CM | POA: Diagnosis not present

## 2015-12-21 DIAGNOSIS — M9904 Segmental and somatic dysfunction of sacral region: Secondary | ICD-10-CM | POA: Diagnosis not present

## 2015-12-21 DIAGNOSIS — M4728 Other spondylosis with radiculopathy, sacral and sacrococcygeal region: Secondary | ICD-10-CM | POA: Diagnosis not present

## 2015-12-21 DIAGNOSIS — M9905 Segmental and somatic dysfunction of pelvic region: Secondary | ICD-10-CM | POA: Diagnosis not present

## 2015-12-21 DIAGNOSIS — M5116 Intervertebral disc disorders with radiculopathy, lumbar region: Secondary | ICD-10-CM | POA: Diagnosis not present

## 2015-12-21 DIAGNOSIS — M9903 Segmental and somatic dysfunction of lumbar region: Secondary | ICD-10-CM | POA: Diagnosis not present

## 2016-02-07 DIAGNOSIS — R69 Illness, unspecified: Secondary | ICD-10-CM | POA: Diagnosis not present

## 2016-04-10 DIAGNOSIS — E669 Obesity, unspecified: Secondary | ICD-10-CM | POA: Diagnosis not present

## 2016-04-10 DIAGNOSIS — K219 Gastro-esophageal reflux disease without esophagitis: Secondary | ICD-10-CM | POA: Diagnosis not present

## 2016-04-10 DIAGNOSIS — I10 Essential (primary) hypertension: Secondary | ICD-10-CM | POA: Diagnosis not present

## 2016-04-10 DIAGNOSIS — M159 Polyosteoarthritis, unspecified: Secondary | ICD-10-CM | POA: Diagnosis not present

## 2016-04-10 DIAGNOSIS — J45909 Unspecified asthma, uncomplicated: Secondary | ICD-10-CM | POA: Diagnosis not present

## 2016-04-10 DIAGNOSIS — E7800 Pure hypercholesterolemia, unspecified: Secondary | ICD-10-CM | POA: Diagnosis not present

## 2016-04-10 DIAGNOSIS — E119 Type 2 diabetes mellitus without complications: Secondary | ICD-10-CM | POA: Diagnosis not present

## 2016-04-10 DIAGNOSIS — N183 Chronic kidney disease, stage 3 (moderate): Secondary | ICD-10-CM | POA: Diagnosis not present

## 2016-04-10 DIAGNOSIS — E1140 Type 2 diabetes mellitus with diabetic neuropathy, unspecified: Secondary | ICD-10-CM | POA: Diagnosis not present

## 2016-04-10 DIAGNOSIS — Z6837 Body mass index (BMI) 37.0-37.9, adult: Secondary | ICD-10-CM | POA: Diagnosis not present

## 2016-04-30 DIAGNOSIS — M159 Polyosteoarthritis, unspecified: Secondary | ICD-10-CM | POA: Diagnosis not present

## 2016-04-30 DIAGNOSIS — N183 Chronic kidney disease, stage 3 (moderate): Secondary | ICD-10-CM | POA: Diagnosis not present

## 2016-04-30 DIAGNOSIS — K219 Gastro-esophageal reflux disease without esophagitis: Secondary | ICD-10-CM | POA: Diagnosis not present

## 2016-04-30 DIAGNOSIS — J45909 Unspecified asthma, uncomplicated: Secondary | ICD-10-CM | POA: Diagnosis not present

## 2016-04-30 DIAGNOSIS — Z6837 Body mass index (BMI) 37.0-37.9, adult: Secondary | ICD-10-CM | POA: Diagnosis not present

## 2016-04-30 DIAGNOSIS — E669 Obesity, unspecified: Secondary | ICD-10-CM | POA: Diagnosis not present

## 2016-04-30 DIAGNOSIS — E119 Type 2 diabetes mellitus without complications: Secondary | ICD-10-CM | POA: Diagnosis not present

## 2016-04-30 DIAGNOSIS — E7800 Pure hypercholesterolemia, unspecified: Secondary | ICD-10-CM | POA: Diagnosis not present

## 2016-04-30 DIAGNOSIS — I10 Essential (primary) hypertension: Secondary | ICD-10-CM | POA: Diagnosis not present

## 2016-04-30 DIAGNOSIS — E1140 Type 2 diabetes mellitus with diabetic neuropathy, unspecified: Secondary | ICD-10-CM | POA: Diagnosis not present

## 2016-05-05 DIAGNOSIS — R69 Illness, unspecified: Secondary | ICD-10-CM | POA: Diagnosis not present

## 2016-05-12 DIAGNOSIS — E784 Other hyperlipidemia: Secondary | ICD-10-CM | POA: Diagnosis not present

## 2016-05-12 DIAGNOSIS — K219 Gastro-esophageal reflux disease without esophagitis: Secondary | ICD-10-CM | POA: Diagnosis not present

## 2016-05-12 DIAGNOSIS — I10 Essential (primary) hypertension: Secondary | ICD-10-CM | POA: Diagnosis not present

## 2016-05-12 DIAGNOSIS — Z0000 Encounter for general adult medical examination without abnormal findings: Secondary | ICD-10-CM | POA: Diagnosis not present

## 2016-05-12 DIAGNOSIS — E1142 Type 2 diabetes mellitus with diabetic polyneuropathy: Secondary | ICD-10-CM | POA: Diagnosis not present

## 2016-05-16 DIAGNOSIS — E119 Type 2 diabetes mellitus without complications: Secondary | ICD-10-CM | POA: Diagnosis not present

## 2016-06-08 DIAGNOSIS — E1140 Type 2 diabetes mellitus with diabetic neuropathy, unspecified: Secondary | ICD-10-CM | POA: Diagnosis not present

## 2016-06-08 DIAGNOSIS — E119 Type 2 diabetes mellitus without complications: Secondary | ICD-10-CM | POA: Diagnosis not present

## 2016-06-08 DIAGNOSIS — Z6837 Body mass index (BMI) 37.0-37.9, adult: Secondary | ICD-10-CM | POA: Diagnosis not present

## 2016-06-08 DIAGNOSIS — J45909 Unspecified asthma, uncomplicated: Secondary | ICD-10-CM | POA: Diagnosis not present

## 2016-06-08 DIAGNOSIS — M159 Polyosteoarthritis, unspecified: Secondary | ICD-10-CM | POA: Diagnosis not present

## 2016-06-08 DIAGNOSIS — K219 Gastro-esophageal reflux disease without esophagitis: Secondary | ICD-10-CM | POA: Diagnosis not present

## 2016-06-08 DIAGNOSIS — I10 Essential (primary) hypertension: Secondary | ICD-10-CM | POA: Diagnosis not present

## 2016-06-08 DIAGNOSIS — E669 Obesity, unspecified: Secondary | ICD-10-CM | POA: Diagnosis not present

## 2016-06-08 DIAGNOSIS — E7800 Pure hypercholesterolemia, unspecified: Secondary | ICD-10-CM | POA: Diagnosis not present

## 2016-06-08 DIAGNOSIS — N183 Chronic kidney disease, stage 3 (moderate): Secondary | ICD-10-CM | POA: Diagnosis not present

## 2016-06-13 DIAGNOSIS — E119 Type 2 diabetes mellitus without complications: Secondary | ICD-10-CM | POA: Diagnosis not present

## 2016-06-13 DIAGNOSIS — H43812 Vitreous degeneration, left eye: Secondary | ICD-10-CM | POA: Diagnosis not present

## 2016-06-13 DIAGNOSIS — H25813 Combined forms of age-related cataract, bilateral: Secondary | ICD-10-CM | POA: Diagnosis not present

## 2016-07-09 DIAGNOSIS — M159 Polyosteoarthritis, unspecified: Secondary | ICD-10-CM | POA: Diagnosis not present

## 2016-07-09 DIAGNOSIS — K219 Gastro-esophageal reflux disease without esophagitis: Secondary | ICD-10-CM | POA: Diagnosis not present

## 2016-07-09 DIAGNOSIS — N183 Chronic kidney disease, stage 3 (moderate): Secondary | ICD-10-CM | POA: Diagnosis not present

## 2016-07-09 DIAGNOSIS — I10 Essential (primary) hypertension: Secondary | ICD-10-CM | POA: Diagnosis not present

## 2016-07-09 DIAGNOSIS — E7800 Pure hypercholesterolemia, unspecified: Secondary | ICD-10-CM | POA: Diagnosis not present

## 2016-07-09 DIAGNOSIS — E669 Obesity, unspecified: Secondary | ICD-10-CM | POA: Diagnosis not present

## 2016-07-09 DIAGNOSIS — J45909 Unspecified asthma, uncomplicated: Secondary | ICD-10-CM | POA: Diagnosis not present

## 2016-07-09 DIAGNOSIS — Z6838 Body mass index (BMI) 38.0-38.9, adult: Secondary | ICD-10-CM | POA: Diagnosis not present

## 2016-07-09 DIAGNOSIS — Z23 Encounter for immunization: Secondary | ICD-10-CM | POA: Diagnosis not present

## 2016-07-09 DIAGNOSIS — E119 Type 2 diabetes mellitus without complications: Secondary | ICD-10-CM | POA: Diagnosis not present

## 2016-09-06 DIAGNOSIS — E119 Type 2 diabetes mellitus without complications: Secondary | ICD-10-CM | POA: Diagnosis not present

## 2016-09-06 DIAGNOSIS — E7800 Pure hypercholesterolemia, unspecified: Secondary | ICD-10-CM | POA: Diagnosis not present

## 2016-09-06 DIAGNOSIS — M159 Polyosteoarthritis, unspecified: Secondary | ICD-10-CM | POA: Diagnosis not present

## 2016-09-06 DIAGNOSIS — J45909 Unspecified asthma, uncomplicated: Secondary | ICD-10-CM | POA: Diagnosis not present

## 2016-09-06 DIAGNOSIS — I10 Essential (primary) hypertension: Secondary | ICD-10-CM | POA: Diagnosis not present

## 2016-09-06 DIAGNOSIS — N183 Chronic kidney disease, stage 3 (moderate): Secondary | ICD-10-CM | POA: Diagnosis not present

## 2016-09-06 DIAGNOSIS — K219 Gastro-esophageal reflux disease without esophagitis: Secondary | ICD-10-CM | POA: Diagnosis not present

## 2016-09-06 DIAGNOSIS — E669 Obesity, unspecified: Secondary | ICD-10-CM | POA: Diagnosis not present

## 2016-09-06 DIAGNOSIS — Z6838 Body mass index (BMI) 38.0-38.9, adult: Secondary | ICD-10-CM | POA: Diagnosis not present

## 2016-09-06 DIAGNOSIS — E1140 Type 2 diabetes mellitus with diabetic neuropathy, unspecified: Secondary | ICD-10-CM | POA: Diagnosis not present

## 2016-09-15 DIAGNOSIS — R69 Illness, unspecified: Secondary | ICD-10-CM | POA: Diagnosis not present

## 2016-09-30 DIAGNOSIS — E119 Type 2 diabetes mellitus without complications: Secondary | ICD-10-CM | POA: Diagnosis not present

## 2016-10-09 DIAGNOSIS — E1140 Type 2 diabetes mellitus with diabetic neuropathy, unspecified: Secondary | ICD-10-CM | POA: Diagnosis not present

## 2016-10-09 DIAGNOSIS — Z1389 Encounter for screening for other disorder: Secondary | ICD-10-CM | POA: Diagnosis not present

## 2016-10-09 DIAGNOSIS — K219 Gastro-esophageal reflux disease without esophagitis: Secondary | ICD-10-CM | POA: Diagnosis not present

## 2016-10-09 DIAGNOSIS — I10 Essential (primary) hypertension: Secondary | ICD-10-CM | POA: Diagnosis not present

## 2016-10-09 DIAGNOSIS — N183 Chronic kidney disease, stage 3 (moderate): Secondary | ICD-10-CM | POA: Diagnosis not present

## 2016-10-09 DIAGNOSIS — E7800 Pure hypercholesterolemia, unspecified: Secondary | ICD-10-CM | POA: Diagnosis not present

## 2016-10-09 DIAGNOSIS — J45909 Unspecified asthma, uncomplicated: Secondary | ICD-10-CM | POA: Diagnosis not present

## 2016-10-09 DIAGNOSIS — M159 Polyosteoarthritis, unspecified: Secondary | ICD-10-CM | POA: Diagnosis not present

## 2016-10-09 DIAGNOSIS — Z9181 History of falling: Secondary | ICD-10-CM | POA: Diagnosis not present

## 2016-10-09 DIAGNOSIS — E119 Type 2 diabetes mellitus without complications: Secondary | ICD-10-CM | POA: Diagnosis not present

## 2016-10-11 DIAGNOSIS — Z1231 Encounter for screening mammogram for malignant neoplasm of breast: Secondary | ICD-10-CM | POA: Diagnosis not present

## 2016-12-17 DIAGNOSIS — N183 Chronic kidney disease, stage 3 (moderate): Secondary | ICD-10-CM | POA: Diagnosis not present

## 2016-12-17 DIAGNOSIS — J45909 Unspecified asthma, uncomplicated: Secondary | ICD-10-CM | POA: Diagnosis not present

## 2016-12-17 DIAGNOSIS — E669 Obesity, unspecified: Secondary | ICD-10-CM | POA: Diagnosis not present

## 2016-12-17 DIAGNOSIS — B029 Zoster without complications: Secondary | ICD-10-CM | POA: Diagnosis not present

## 2016-12-17 DIAGNOSIS — K219 Gastro-esophageal reflux disease without esophagitis: Secondary | ICD-10-CM | POA: Diagnosis not present

## 2016-12-17 DIAGNOSIS — E119 Type 2 diabetes mellitus without complications: Secondary | ICD-10-CM | POA: Diagnosis not present

## 2016-12-17 DIAGNOSIS — I10 Essential (primary) hypertension: Secondary | ICD-10-CM | POA: Diagnosis not present

## 2016-12-17 DIAGNOSIS — E7800 Pure hypercholesterolemia, unspecified: Secondary | ICD-10-CM | POA: Diagnosis not present

## 2016-12-17 DIAGNOSIS — E1140 Type 2 diabetes mellitus with diabetic neuropathy, unspecified: Secondary | ICD-10-CM | POA: Diagnosis not present

## 2016-12-17 DIAGNOSIS — M159 Polyosteoarthritis, unspecified: Secondary | ICD-10-CM | POA: Diagnosis not present

## 2016-12-24 DIAGNOSIS — I10 Essential (primary) hypertension: Secondary | ICD-10-CM | POA: Diagnosis not present

## 2016-12-24 DIAGNOSIS — E1140 Type 2 diabetes mellitus with diabetic neuropathy, unspecified: Secondary | ICD-10-CM | POA: Diagnosis not present

## 2016-12-24 DIAGNOSIS — R69 Illness, unspecified: Secondary | ICD-10-CM | POA: Diagnosis not present

## 2016-12-24 DIAGNOSIS — E7800 Pure hypercholesterolemia, unspecified: Secondary | ICD-10-CM | POA: Diagnosis not present

## 2016-12-24 DIAGNOSIS — J45909 Unspecified asthma, uncomplicated: Secondary | ICD-10-CM | POA: Diagnosis not present

## 2016-12-24 DIAGNOSIS — M159 Polyosteoarthritis, unspecified: Secondary | ICD-10-CM | POA: Diagnosis not present

## 2016-12-24 DIAGNOSIS — B029 Zoster without complications: Secondary | ICD-10-CM | POA: Diagnosis not present

## 2016-12-24 DIAGNOSIS — N183 Chronic kidney disease, stage 3 (moderate): Secondary | ICD-10-CM | POA: Diagnosis not present

## 2016-12-24 DIAGNOSIS — K219 Gastro-esophageal reflux disease without esophagitis: Secondary | ICD-10-CM | POA: Diagnosis not present

## 2016-12-24 DIAGNOSIS — E119 Type 2 diabetes mellitus without complications: Secondary | ICD-10-CM | POA: Diagnosis not present

## 2016-12-24 DIAGNOSIS — E669 Obesity, unspecified: Secondary | ICD-10-CM | POA: Diagnosis not present

## 2017-01-09 DIAGNOSIS — E669 Obesity, unspecified: Secondary | ICD-10-CM | POA: Diagnosis not present

## 2017-01-09 DIAGNOSIS — B029 Zoster without complications: Secondary | ICD-10-CM | POA: Diagnosis not present

## 2017-01-09 DIAGNOSIS — E1140 Type 2 diabetes mellitus with diabetic neuropathy, unspecified: Secondary | ICD-10-CM | POA: Diagnosis not present

## 2017-01-09 DIAGNOSIS — E119 Type 2 diabetes mellitus without complications: Secondary | ICD-10-CM | POA: Diagnosis not present

## 2017-01-09 DIAGNOSIS — J45909 Unspecified asthma, uncomplicated: Secondary | ICD-10-CM | POA: Diagnosis not present

## 2017-01-09 DIAGNOSIS — N183 Chronic kidney disease, stage 3 (moderate): Secondary | ICD-10-CM | POA: Diagnosis not present

## 2017-01-09 DIAGNOSIS — I10 Essential (primary) hypertension: Secondary | ICD-10-CM | POA: Diagnosis not present

## 2017-01-09 DIAGNOSIS — M159 Polyosteoarthritis, unspecified: Secondary | ICD-10-CM | POA: Diagnosis not present

## 2017-01-09 DIAGNOSIS — E7800 Pure hypercholesterolemia, unspecified: Secondary | ICD-10-CM | POA: Diagnosis not present

## 2017-01-09 DIAGNOSIS — K219 Gastro-esophageal reflux disease without esophagitis: Secondary | ICD-10-CM | POA: Diagnosis not present

## 2017-01-21 DIAGNOSIS — H25013 Cortical age-related cataract, bilateral: Secondary | ICD-10-CM | POA: Diagnosis not present

## 2017-01-21 DIAGNOSIS — E119 Type 2 diabetes mellitus without complications: Secondary | ICD-10-CM | POA: Diagnosis not present

## 2017-03-12 DIAGNOSIS — R351 Nocturia: Secondary | ICD-10-CM | POA: Diagnosis not present

## 2017-03-12 DIAGNOSIS — Z79891 Long term (current) use of opiate analgesic: Secondary | ICD-10-CM | POA: Diagnosis not present

## 2017-03-12 DIAGNOSIS — Z79899 Other long term (current) drug therapy: Secondary | ICD-10-CM | POA: Diagnosis not present

## 2017-03-12 DIAGNOSIS — E785 Hyperlipidemia, unspecified: Secondary | ICD-10-CM | POA: Diagnosis not present

## 2017-03-12 DIAGNOSIS — Z7984 Long term (current) use of oral hypoglycemic drugs: Secondary | ICD-10-CM | POA: Diagnosis not present

## 2017-03-12 DIAGNOSIS — Z7982 Long term (current) use of aspirin: Secondary | ICD-10-CM | POA: Diagnosis not present

## 2017-03-12 DIAGNOSIS — K219 Gastro-esophageal reflux disease without esophagitis: Secondary | ICD-10-CM | POA: Diagnosis not present

## 2017-03-12 DIAGNOSIS — I10 Essential (primary) hypertension: Secondary | ICD-10-CM | POA: Diagnosis not present

## 2017-03-12 DIAGNOSIS — Z0000 Encounter for general adult medical examination without abnormal findings: Secondary | ICD-10-CM | POA: Diagnosis not present

## 2017-03-12 DIAGNOSIS — E1142 Type 2 diabetes mellitus with diabetic polyneuropathy: Secondary | ICD-10-CM | POA: Diagnosis not present

## 2017-03-12 DIAGNOSIS — K08109 Complete loss of teeth, unspecified cause, unspecified class: Secondary | ICD-10-CM | POA: Diagnosis not present

## 2017-03-12 DIAGNOSIS — R42 Dizziness and giddiness: Secondary | ICD-10-CM | POA: Diagnosis not present

## 2017-03-12 DIAGNOSIS — R3915 Urgency of urination: Secondary | ICD-10-CM | POA: Diagnosis not present

## 2017-03-12 DIAGNOSIS — Z6837 Body mass index (BMI) 37.0-37.9, adult: Secondary | ICD-10-CM | POA: Diagnosis not present

## 2017-03-12 DIAGNOSIS — K229 Disease of esophagus, unspecified: Secondary | ICD-10-CM | POA: Diagnosis not present

## 2017-03-12 DIAGNOSIS — E669 Obesity, unspecified: Secondary | ICD-10-CM | POA: Diagnosis not present

## 2017-03-12 DIAGNOSIS — H9319 Tinnitus, unspecified ear: Secondary | ICD-10-CM | POA: Diagnosis not present

## 2017-03-12 DIAGNOSIS — M159 Polyosteoarthritis, unspecified: Secondary | ICD-10-CM | POA: Diagnosis not present

## 2017-03-12 DIAGNOSIS — R609 Edema, unspecified: Secondary | ICD-10-CM | POA: Diagnosis not present

## 2017-03-27 DIAGNOSIS — R69 Illness, unspecified: Secondary | ICD-10-CM | POA: Diagnosis not present

## 2017-04-11 DIAGNOSIS — I10 Essential (primary) hypertension: Secondary | ICD-10-CM | POA: Diagnosis not present

## 2017-04-11 DIAGNOSIS — J45909 Unspecified asthma, uncomplicated: Secondary | ICD-10-CM | POA: Diagnosis not present

## 2017-04-11 DIAGNOSIS — N183 Chronic kidney disease, stage 3 (moderate): Secondary | ICD-10-CM | POA: Diagnosis not present

## 2017-04-11 DIAGNOSIS — E119 Type 2 diabetes mellitus without complications: Secondary | ICD-10-CM | POA: Diagnosis not present

## 2017-04-11 DIAGNOSIS — E669 Obesity, unspecified: Secondary | ICD-10-CM | POA: Diagnosis not present

## 2017-04-11 DIAGNOSIS — M159 Polyosteoarthritis, unspecified: Secondary | ICD-10-CM | POA: Diagnosis not present

## 2017-04-11 DIAGNOSIS — E7800 Pure hypercholesterolemia, unspecified: Secondary | ICD-10-CM | POA: Diagnosis not present

## 2017-04-11 DIAGNOSIS — K219 Gastro-esophageal reflux disease without esophagitis: Secondary | ICD-10-CM | POA: Diagnosis not present

## 2017-04-11 DIAGNOSIS — Z6838 Body mass index (BMI) 38.0-38.9, adult: Secondary | ICD-10-CM | POA: Diagnosis not present

## 2017-04-11 DIAGNOSIS — E1140 Type 2 diabetes mellitus with diabetic neuropathy, unspecified: Secondary | ICD-10-CM | POA: Diagnosis not present

## 2017-06-01 DIAGNOSIS — R69 Illness, unspecified: Secondary | ICD-10-CM | POA: Diagnosis not present

## 2017-06-11 DIAGNOSIS — R69 Illness, unspecified: Secondary | ICD-10-CM | POA: Diagnosis not present

## 2017-07-12 DIAGNOSIS — M159 Polyosteoarthritis, unspecified: Secondary | ICD-10-CM | POA: Diagnosis not present

## 2017-07-12 DIAGNOSIS — Z23 Encounter for immunization: Secondary | ICD-10-CM | POA: Diagnosis not present

## 2017-07-12 DIAGNOSIS — E1140 Type 2 diabetes mellitus with diabetic neuropathy, unspecified: Secondary | ICD-10-CM | POA: Diagnosis not present

## 2017-07-12 DIAGNOSIS — E119 Type 2 diabetes mellitus without complications: Secondary | ICD-10-CM | POA: Diagnosis not present

## 2017-07-12 DIAGNOSIS — I10 Essential (primary) hypertension: Secondary | ICD-10-CM | POA: Diagnosis not present

## 2017-07-12 DIAGNOSIS — J45909 Unspecified asthma, uncomplicated: Secondary | ICD-10-CM | POA: Diagnosis not present

## 2017-07-12 DIAGNOSIS — E669 Obesity, unspecified: Secondary | ICD-10-CM | POA: Diagnosis not present

## 2017-07-12 DIAGNOSIS — N183 Chronic kidney disease, stage 3 (moderate): Secondary | ICD-10-CM | POA: Diagnosis not present

## 2017-07-12 DIAGNOSIS — K219 Gastro-esophageal reflux disease without esophagitis: Secondary | ICD-10-CM | POA: Diagnosis not present

## 2017-07-12 DIAGNOSIS — E7800 Pure hypercholesterolemia, unspecified: Secondary | ICD-10-CM | POA: Diagnosis not present

## 2017-07-16 DIAGNOSIS — E119 Type 2 diabetes mellitus without complications: Secondary | ICD-10-CM | POA: Diagnosis not present

## 2017-07-22 DIAGNOSIS — I10 Essential (primary) hypertension: Secondary | ICD-10-CM | POA: Diagnosis not present

## 2017-07-22 DIAGNOSIS — E1140 Type 2 diabetes mellitus with diabetic neuropathy, unspecified: Secondary | ICD-10-CM | POA: Diagnosis not present

## 2017-07-22 DIAGNOSIS — M159 Polyosteoarthritis, unspecified: Secondary | ICD-10-CM | POA: Diagnosis not present

## 2017-07-22 DIAGNOSIS — E669 Obesity, unspecified: Secondary | ICD-10-CM | POA: Diagnosis not present

## 2017-07-22 DIAGNOSIS — N183 Chronic kidney disease, stage 3 (moderate): Secondary | ICD-10-CM | POA: Diagnosis not present

## 2017-07-22 DIAGNOSIS — J208 Acute bronchitis due to other specified organisms: Secondary | ICD-10-CM | POA: Diagnosis not present

## 2017-07-22 DIAGNOSIS — E7800 Pure hypercholesterolemia, unspecified: Secondary | ICD-10-CM | POA: Diagnosis not present

## 2017-07-22 DIAGNOSIS — J45909 Unspecified asthma, uncomplicated: Secondary | ICD-10-CM | POA: Diagnosis not present

## 2017-07-22 DIAGNOSIS — K219 Gastro-esophageal reflux disease without esophagitis: Secondary | ICD-10-CM | POA: Diagnosis not present

## 2017-07-22 DIAGNOSIS — E119 Type 2 diabetes mellitus without complications: Secondary | ICD-10-CM | POA: Diagnosis not present

## 2017-07-25 DIAGNOSIS — H25813 Combined forms of age-related cataract, bilateral: Secondary | ICD-10-CM | POA: Diagnosis not present

## 2017-08-12 DIAGNOSIS — Z6838 Body mass index (BMI) 38.0-38.9, adult: Secondary | ICD-10-CM | POA: Diagnosis not present

## 2017-08-12 DIAGNOSIS — J45909 Unspecified asthma, uncomplicated: Secondary | ICD-10-CM | POA: Diagnosis not present

## 2017-08-12 DIAGNOSIS — E669 Obesity, unspecified: Secondary | ICD-10-CM | POA: Diagnosis not present

## 2017-08-12 DIAGNOSIS — E119 Type 2 diabetes mellitus without complications: Secondary | ICD-10-CM | POA: Diagnosis not present

## 2017-08-12 DIAGNOSIS — E7800 Pure hypercholesterolemia, unspecified: Secondary | ICD-10-CM | POA: Diagnosis not present

## 2017-08-12 DIAGNOSIS — E1140 Type 2 diabetes mellitus with diabetic neuropathy, unspecified: Secondary | ICD-10-CM | POA: Diagnosis not present

## 2017-08-12 DIAGNOSIS — M159 Polyosteoarthritis, unspecified: Secondary | ICD-10-CM | POA: Diagnosis not present

## 2017-08-12 DIAGNOSIS — K219 Gastro-esophageal reflux disease without esophagitis: Secondary | ICD-10-CM | POA: Diagnosis not present

## 2017-08-12 DIAGNOSIS — N183 Chronic kidney disease, stage 3 (moderate): Secondary | ICD-10-CM | POA: Diagnosis not present

## 2017-08-12 DIAGNOSIS — I10 Essential (primary) hypertension: Secondary | ICD-10-CM | POA: Diagnosis not present

## 2017-08-27 DIAGNOSIS — R69 Illness, unspecified: Secondary | ICD-10-CM | POA: Diagnosis not present

## 2017-09-17 DIAGNOSIS — H25812 Combined forms of age-related cataract, left eye: Secondary | ICD-10-CM | POA: Diagnosis not present

## 2017-10-16 DIAGNOSIS — E1140 Type 2 diabetes mellitus with diabetic neuropathy, unspecified: Secondary | ICD-10-CM | POA: Diagnosis not present

## 2017-10-16 DIAGNOSIS — Z9181 History of falling: Secondary | ICD-10-CM | POA: Diagnosis not present

## 2017-10-16 DIAGNOSIS — E119 Type 2 diabetes mellitus without complications: Secondary | ICD-10-CM | POA: Diagnosis not present

## 2017-10-16 DIAGNOSIS — M159 Polyosteoarthritis, unspecified: Secondary | ICD-10-CM | POA: Diagnosis not present

## 2017-10-16 DIAGNOSIS — J45909 Unspecified asthma, uncomplicated: Secondary | ICD-10-CM | POA: Diagnosis not present

## 2017-10-16 DIAGNOSIS — K219 Gastro-esophageal reflux disease without esophagitis: Secondary | ICD-10-CM | POA: Diagnosis not present

## 2017-10-16 DIAGNOSIS — I10 Essential (primary) hypertension: Secondary | ICD-10-CM | POA: Diagnosis not present

## 2017-10-16 DIAGNOSIS — E7800 Pure hypercholesterolemia, unspecified: Secondary | ICD-10-CM | POA: Diagnosis not present

## 2017-10-16 DIAGNOSIS — N183 Chronic kidney disease, stage 3 (moderate): Secondary | ICD-10-CM | POA: Diagnosis not present

## 2017-10-16 DIAGNOSIS — Z1331 Encounter for screening for depression: Secondary | ICD-10-CM | POA: Diagnosis not present

## 2017-10-22 DIAGNOSIS — Z1231 Encounter for screening mammogram for malignant neoplasm of breast: Secondary | ICD-10-CM | POA: Diagnosis not present

## 2017-10-22 DIAGNOSIS — Z01818 Encounter for other preprocedural examination: Secondary | ICD-10-CM | POA: Diagnosis not present

## 2017-10-22 DIAGNOSIS — H25812 Combined forms of age-related cataract, left eye: Secondary | ICD-10-CM | POA: Diagnosis not present

## 2017-10-29 DIAGNOSIS — Z7982 Long term (current) use of aspirin: Secondary | ICD-10-CM | POA: Diagnosis not present

## 2017-10-29 DIAGNOSIS — I10 Essential (primary) hypertension: Secondary | ICD-10-CM | POA: Diagnosis not present

## 2017-10-29 DIAGNOSIS — K219 Gastro-esophageal reflux disease without esophagitis: Secondary | ICD-10-CM | POA: Diagnosis not present

## 2017-10-29 DIAGNOSIS — Z7984 Long term (current) use of oral hypoglycemic drugs: Secondary | ICD-10-CM | POA: Diagnosis not present

## 2017-10-29 DIAGNOSIS — M1990 Unspecified osteoarthritis, unspecified site: Secondary | ICD-10-CM | POA: Diagnosis not present

## 2017-10-29 DIAGNOSIS — H259 Unspecified age-related cataract: Secondary | ICD-10-CM | POA: Diagnosis not present

## 2017-10-29 DIAGNOSIS — E1140 Type 2 diabetes mellitus with diabetic neuropathy, unspecified: Secondary | ICD-10-CM | POA: Diagnosis not present

## 2017-10-29 DIAGNOSIS — E785 Hyperlipidemia, unspecified: Secondary | ICD-10-CM | POA: Diagnosis not present

## 2017-10-29 DIAGNOSIS — H25812 Combined forms of age-related cataract, left eye: Secondary | ICD-10-CM | POA: Diagnosis not present

## 2017-10-29 DIAGNOSIS — E1136 Type 2 diabetes mellitus with diabetic cataract: Secondary | ICD-10-CM | POA: Diagnosis not present

## 2017-10-29 DIAGNOSIS — Z79899 Other long term (current) drug therapy: Secondary | ICD-10-CM | POA: Diagnosis not present

## 2017-11-04 DIAGNOSIS — E1140 Type 2 diabetes mellitus with diabetic neuropathy, unspecified: Secondary | ICD-10-CM | POA: Diagnosis not present

## 2017-11-04 DIAGNOSIS — M159 Polyosteoarthritis, unspecified: Secondary | ICD-10-CM | POA: Diagnosis not present

## 2017-11-04 DIAGNOSIS — K219 Gastro-esophageal reflux disease without esophagitis: Secondary | ICD-10-CM | POA: Diagnosis not present

## 2017-11-04 DIAGNOSIS — J45909 Unspecified asthma, uncomplicated: Secondary | ICD-10-CM | POA: Diagnosis not present

## 2017-11-04 DIAGNOSIS — E119 Type 2 diabetes mellitus without complications: Secondary | ICD-10-CM | POA: Diagnosis not present

## 2017-11-04 DIAGNOSIS — I10 Essential (primary) hypertension: Secondary | ICD-10-CM | POA: Diagnosis not present

## 2017-11-04 DIAGNOSIS — E7800 Pure hypercholesterolemia, unspecified: Secondary | ICD-10-CM | POA: Diagnosis not present

## 2017-11-04 DIAGNOSIS — B359 Dermatophytosis, unspecified: Secondary | ICD-10-CM | POA: Diagnosis not present

## 2017-11-04 DIAGNOSIS — M60851 Other myositis, right thigh: Secondary | ICD-10-CM | POA: Diagnosis not present

## 2017-11-04 DIAGNOSIS — N183 Chronic kidney disease, stage 3 (moderate): Secondary | ICD-10-CM | POA: Diagnosis not present

## 2017-11-07 DIAGNOSIS — N6489 Other specified disorders of breast: Secondary | ICD-10-CM | POA: Diagnosis not present

## 2017-11-07 DIAGNOSIS — R928 Other abnormal and inconclusive findings on diagnostic imaging of breast: Secondary | ICD-10-CM | POA: Diagnosis not present

## 2017-11-19 DIAGNOSIS — N183 Chronic kidney disease, stage 3 (moderate): Secondary | ICD-10-CM | POA: Diagnosis not present

## 2017-11-19 DIAGNOSIS — Z6838 Body mass index (BMI) 38.0-38.9, adult: Secondary | ICD-10-CM | POA: Diagnosis not present

## 2017-11-19 DIAGNOSIS — M159 Polyosteoarthritis, unspecified: Secondary | ICD-10-CM | POA: Diagnosis not present

## 2017-11-19 DIAGNOSIS — K219 Gastro-esophageal reflux disease without esophagitis: Secondary | ICD-10-CM | POA: Diagnosis not present

## 2017-11-19 DIAGNOSIS — I10 Essential (primary) hypertension: Secondary | ICD-10-CM | POA: Diagnosis not present

## 2017-11-19 DIAGNOSIS — J45909 Unspecified asthma, uncomplicated: Secondary | ICD-10-CM | POA: Diagnosis not present

## 2017-11-19 DIAGNOSIS — E119 Type 2 diabetes mellitus without complications: Secondary | ICD-10-CM | POA: Diagnosis not present

## 2017-11-19 DIAGNOSIS — E7800 Pure hypercholesterolemia, unspecified: Secondary | ICD-10-CM | POA: Diagnosis not present

## 2017-11-19 DIAGNOSIS — E1140 Type 2 diabetes mellitus with diabetic neuropathy, unspecified: Secondary | ICD-10-CM | POA: Diagnosis not present

## 2017-11-19 DIAGNOSIS — E669 Obesity, unspecified: Secondary | ICD-10-CM | POA: Diagnosis not present

## 2017-11-23 DIAGNOSIS — R69 Illness, unspecified: Secondary | ICD-10-CM | POA: Diagnosis not present

## 2017-11-26 DIAGNOSIS — Z7982 Long term (current) use of aspirin: Secondary | ICD-10-CM | POA: Diagnosis not present

## 2017-11-26 DIAGNOSIS — E1140 Type 2 diabetes mellitus with diabetic neuropathy, unspecified: Secondary | ICD-10-CM | POA: Diagnosis not present

## 2017-11-26 DIAGNOSIS — M1990 Unspecified osteoarthritis, unspecified site: Secondary | ICD-10-CM | POA: Diagnosis not present

## 2017-11-26 DIAGNOSIS — H25811 Combined forms of age-related cataract, right eye: Secondary | ICD-10-CM | POA: Diagnosis not present

## 2017-11-26 DIAGNOSIS — E785 Hyperlipidemia, unspecified: Secondary | ICD-10-CM | POA: Diagnosis not present

## 2017-11-26 DIAGNOSIS — H259 Unspecified age-related cataract: Secondary | ICD-10-CM | POA: Diagnosis not present

## 2017-11-26 DIAGNOSIS — Z79899 Other long term (current) drug therapy: Secondary | ICD-10-CM | POA: Diagnosis not present

## 2017-11-26 DIAGNOSIS — K219 Gastro-esophageal reflux disease without esophagitis: Secondary | ICD-10-CM | POA: Diagnosis not present

## 2017-11-26 DIAGNOSIS — I10 Essential (primary) hypertension: Secondary | ICD-10-CM | POA: Diagnosis not present

## 2017-11-26 DIAGNOSIS — Z7984 Long term (current) use of oral hypoglycemic drugs: Secondary | ICD-10-CM | POA: Diagnosis not present

## 2017-12-17 DIAGNOSIS — J45909 Unspecified asthma, uncomplicated: Secondary | ICD-10-CM | POA: Diagnosis not present

## 2017-12-17 DIAGNOSIS — K589 Irritable bowel syndrome without diarrhea: Secondary | ICD-10-CM | POA: Diagnosis not present

## 2017-12-17 DIAGNOSIS — I10 Essential (primary) hypertension: Secondary | ICD-10-CM | POA: Diagnosis not present

## 2017-12-17 DIAGNOSIS — N183 Chronic kidney disease, stage 3 (moderate): Secondary | ICD-10-CM | POA: Diagnosis not present

## 2017-12-17 DIAGNOSIS — E7800 Pure hypercholesterolemia, unspecified: Secondary | ICD-10-CM | POA: Diagnosis not present

## 2017-12-17 DIAGNOSIS — K219 Gastro-esophageal reflux disease without esophagitis: Secondary | ICD-10-CM | POA: Diagnosis not present

## 2017-12-17 DIAGNOSIS — E669 Obesity, unspecified: Secondary | ICD-10-CM | POA: Diagnosis not present

## 2017-12-17 DIAGNOSIS — E119 Type 2 diabetes mellitus without complications: Secondary | ICD-10-CM | POA: Diagnosis not present

## 2017-12-17 DIAGNOSIS — M159 Polyosteoarthritis, unspecified: Secondary | ICD-10-CM | POA: Diagnosis not present

## 2017-12-17 DIAGNOSIS — E1140 Type 2 diabetes mellitus with diabetic neuropathy, unspecified: Secondary | ICD-10-CM | POA: Diagnosis not present

## 2017-12-24 DIAGNOSIS — E669 Obesity, unspecified: Secondary | ICD-10-CM | POA: Diagnosis not present

## 2017-12-24 DIAGNOSIS — I10 Essential (primary) hypertension: Secondary | ICD-10-CM | POA: Diagnosis not present

## 2017-12-24 DIAGNOSIS — N183 Chronic kidney disease, stage 3 (moderate): Secondary | ICD-10-CM | POA: Diagnosis not present

## 2017-12-24 DIAGNOSIS — K219 Gastro-esophageal reflux disease without esophagitis: Secondary | ICD-10-CM | POA: Diagnosis not present

## 2017-12-24 DIAGNOSIS — J45909 Unspecified asthma, uncomplicated: Secondary | ICD-10-CM | POA: Diagnosis not present

## 2017-12-24 DIAGNOSIS — E1140 Type 2 diabetes mellitus with diabetic neuropathy, unspecified: Secondary | ICD-10-CM | POA: Diagnosis not present

## 2017-12-24 DIAGNOSIS — M159 Polyosteoarthritis, unspecified: Secondary | ICD-10-CM | POA: Diagnosis not present

## 2017-12-24 DIAGNOSIS — E119 Type 2 diabetes mellitus without complications: Secondary | ICD-10-CM | POA: Diagnosis not present

## 2017-12-24 DIAGNOSIS — E7800 Pure hypercholesterolemia, unspecified: Secondary | ICD-10-CM | POA: Diagnosis not present

## 2017-12-24 DIAGNOSIS — K589 Irritable bowel syndrome without diarrhea: Secondary | ICD-10-CM | POA: Diagnosis not present

## 2017-12-25 DIAGNOSIS — H524 Presbyopia: Secondary | ICD-10-CM | POA: Diagnosis not present

## 2018-01-16 DIAGNOSIS — E1140 Type 2 diabetes mellitus with diabetic neuropathy, unspecified: Secondary | ICD-10-CM | POA: Diagnosis not present

## 2018-01-16 DIAGNOSIS — K589 Irritable bowel syndrome without diarrhea: Secondary | ICD-10-CM | POA: Diagnosis not present

## 2018-01-16 DIAGNOSIS — E7800 Pure hypercholesterolemia, unspecified: Secondary | ICD-10-CM | POA: Diagnosis not present

## 2018-01-16 DIAGNOSIS — N183 Chronic kidney disease, stage 3 (moderate): Secondary | ICD-10-CM | POA: Diagnosis not present

## 2018-01-16 DIAGNOSIS — L0390 Cellulitis, unspecified: Secondary | ICD-10-CM | POA: Diagnosis not present

## 2018-01-16 DIAGNOSIS — J45909 Unspecified asthma, uncomplicated: Secondary | ICD-10-CM | POA: Diagnosis not present

## 2018-01-16 DIAGNOSIS — K219 Gastro-esophageal reflux disease without esophagitis: Secondary | ICD-10-CM | POA: Diagnosis not present

## 2018-01-16 DIAGNOSIS — E119 Type 2 diabetes mellitus without complications: Secondary | ICD-10-CM | POA: Diagnosis not present

## 2018-01-16 DIAGNOSIS — M159 Polyosteoarthritis, unspecified: Secondary | ICD-10-CM | POA: Diagnosis not present

## 2018-01-16 DIAGNOSIS — I10 Essential (primary) hypertension: Secondary | ICD-10-CM | POA: Diagnosis not present

## 2018-01-21 DIAGNOSIS — E119 Type 2 diabetes mellitus without complications: Secondary | ICD-10-CM | POA: Diagnosis not present

## 2018-01-30 DIAGNOSIS — Z6838 Body mass index (BMI) 38.0-38.9, adult: Secondary | ICD-10-CM | POA: Diagnosis not present

## 2018-01-30 DIAGNOSIS — M25561 Pain in right knee: Secondary | ICD-10-CM | POA: Diagnosis not present

## 2018-01-30 DIAGNOSIS — E1140 Type 2 diabetes mellitus with diabetic neuropathy, unspecified: Secondary | ICD-10-CM | POA: Diagnosis not present

## 2018-01-30 DIAGNOSIS — J45909 Unspecified asthma, uncomplicated: Secondary | ICD-10-CM | POA: Diagnosis not present

## 2018-01-30 DIAGNOSIS — K589 Irritable bowel syndrome without diarrhea: Secondary | ICD-10-CM | POA: Diagnosis not present

## 2018-01-30 DIAGNOSIS — E669 Obesity, unspecified: Secondary | ICD-10-CM | POA: Diagnosis not present

## 2018-01-30 DIAGNOSIS — N183 Chronic kidney disease, stage 3 (moderate): Secondary | ICD-10-CM | POA: Diagnosis not present

## 2018-01-30 DIAGNOSIS — K219 Gastro-esophageal reflux disease without esophagitis: Secondary | ICD-10-CM | POA: Diagnosis not present

## 2018-01-30 DIAGNOSIS — M159 Polyosteoarthritis, unspecified: Secondary | ICD-10-CM | POA: Diagnosis not present

## 2018-02-12 DIAGNOSIS — K08109 Complete loss of teeth, unspecified cause, unspecified class: Secondary | ICD-10-CM | POA: Diagnosis not present

## 2018-02-12 DIAGNOSIS — M1990 Unspecified osteoarthritis, unspecified site: Secondary | ICD-10-CM | POA: Diagnosis not present

## 2018-02-12 DIAGNOSIS — E785 Hyperlipidemia, unspecified: Secondary | ICD-10-CM | POA: Diagnosis not present

## 2018-02-12 DIAGNOSIS — I10 Essential (primary) hypertension: Secondary | ICD-10-CM | POA: Diagnosis not present

## 2018-02-12 DIAGNOSIS — Z791 Long term (current) use of non-steroidal anti-inflammatories (NSAID): Secondary | ICD-10-CM | POA: Diagnosis not present

## 2018-02-12 DIAGNOSIS — E119 Type 2 diabetes mellitus without complications: Secondary | ICD-10-CM | POA: Diagnosis not present

## 2018-02-12 DIAGNOSIS — Z6836 Body mass index (BMI) 36.0-36.9, adult: Secondary | ICD-10-CM | POA: Diagnosis not present

## 2018-02-12 DIAGNOSIS — G8929 Other chronic pain: Secondary | ICD-10-CM | POA: Diagnosis not present

## 2018-02-12 DIAGNOSIS — K219 Gastro-esophageal reflux disease without esophagitis: Secondary | ICD-10-CM | POA: Diagnosis not present

## 2018-02-17 DIAGNOSIS — S83402A Sprain of unspecified collateral ligament of left knee, initial encounter: Secondary | ICD-10-CM | POA: Diagnosis not present

## 2018-02-24 DIAGNOSIS — R69 Illness, unspecified: Secondary | ICD-10-CM | POA: Diagnosis not present

## 2018-02-25 DIAGNOSIS — Z961 Presence of intraocular lens: Secondary | ICD-10-CM | POA: Diagnosis not present

## 2018-04-17 DIAGNOSIS — J45909 Unspecified asthma, uncomplicated: Secondary | ICD-10-CM | POA: Diagnosis not present

## 2018-04-17 DIAGNOSIS — I10 Essential (primary) hypertension: Secondary | ICD-10-CM | POA: Diagnosis not present

## 2018-04-17 DIAGNOSIS — K219 Gastro-esophageal reflux disease without esophagitis: Secondary | ICD-10-CM | POA: Diagnosis not present

## 2018-04-17 DIAGNOSIS — E1140 Type 2 diabetes mellitus with diabetic neuropathy, unspecified: Secondary | ICD-10-CM | POA: Diagnosis not present

## 2018-04-17 DIAGNOSIS — N183 Chronic kidney disease, stage 3 (moderate): Secondary | ICD-10-CM | POA: Diagnosis not present

## 2018-04-17 DIAGNOSIS — K589 Irritable bowel syndrome without diarrhea: Secondary | ICD-10-CM | POA: Diagnosis not present

## 2018-04-17 DIAGNOSIS — M159 Polyosteoarthritis, unspecified: Secondary | ICD-10-CM | POA: Diagnosis not present

## 2018-04-17 DIAGNOSIS — E7800 Pure hypercholesterolemia, unspecified: Secondary | ICD-10-CM | POA: Diagnosis not present

## 2018-04-17 DIAGNOSIS — E119 Type 2 diabetes mellitus without complications: Secondary | ICD-10-CM | POA: Diagnosis not present

## 2018-04-17 DIAGNOSIS — E669 Obesity, unspecified: Secondary | ICD-10-CM | POA: Diagnosis not present

## 2018-05-01 DIAGNOSIS — K219 Gastro-esophageal reflux disease without esophagitis: Secondary | ICD-10-CM | POA: Diagnosis not present

## 2018-05-01 DIAGNOSIS — E119 Type 2 diabetes mellitus without complications: Secondary | ICD-10-CM | POA: Diagnosis not present

## 2018-05-01 DIAGNOSIS — J45909 Unspecified asthma, uncomplicated: Secondary | ICD-10-CM | POA: Diagnosis not present

## 2018-05-01 DIAGNOSIS — M25511 Pain in right shoulder: Secondary | ICD-10-CM | POA: Diagnosis not present

## 2018-05-01 DIAGNOSIS — I10 Essential (primary) hypertension: Secondary | ICD-10-CM | POA: Diagnosis not present

## 2018-05-01 DIAGNOSIS — N183 Chronic kidney disease, stage 3 (moderate): Secondary | ICD-10-CM | POA: Diagnosis not present

## 2018-05-01 DIAGNOSIS — E7800 Pure hypercholesterolemia, unspecified: Secondary | ICD-10-CM | POA: Diagnosis not present

## 2018-05-01 DIAGNOSIS — E669 Obesity, unspecified: Secondary | ICD-10-CM | POA: Diagnosis not present

## 2018-05-01 DIAGNOSIS — E1140 Type 2 diabetes mellitus with diabetic neuropathy, unspecified: Secondary | ICD-10-CM | POA: Diagnosis not present

## 2018-05-01 DIAGNOSIS — K589 Irritable bowel syndrome without diarrhea: Secondary | ICD-10-CM | POA: Diagnosis not present

## 2018-05-26 DIAGNOSIS — R69 Illness, unspecified: Secondary | ICD-10-CM | POA: Diagnosis not present

## 2018-05-29 DIAGNOSIS — N183 Chronic kidney disease, stage 3 (moderate): Secondary | ICD-10-CM | POA: Diagnosis not present

## 2018-05-29 DIAGNOSIS — J208 Acute bronchitis due to other specified organisms: Secondary | ICD-10-CM | POA: Diagnosis not present

## 2018-05-29 DIAGNOSIS — M25511 Pain in right shoulder: Secondary | ICD-10-CM | POA: Diagnosis not present

## 2018-05-29 DIAGNOSIS — E119 Type 2 diabetes mellitus without complications: Secondary | ICD-10-CM | POA: Diagnosis not present

## 2018-05-29 DIAGNOSIS — E1140 Type 2 diabetes mellitus with diabetic neuropathy, unspecified: Secondary | ICD-10-CM | POA: Diagnosis not present

## 2018-05-29 DIAGNOSIS — J45909 Unspecified asthma, uncomplicated: Secondary | ICD-10-CM | POA: Diagnosis not present

## 2018-05-29 DIAGNOSIS — E669 Obesity, unspecified: Secondary | ICD-10-CM | POA: Diagnosis not present

## 2018-05-29 DIAGNOSIS — K219 Gastro-esophageal reflux disease without esophagitis: Secondary | ICD-10-CM | POA: Diagnosis not present

## 2018-05-29 DIAGNOSIS — E7800 Pure hypercholesterolemia, unspecified: Secondary | ICD-10-CM | POA: Diagnosis not present

## 2018-05-29 DIAGNOSIS — I10 Essential (primary) hypertension: Secondary | ICD-10-CM | POA: Diagnosis not present

## 2018-06-26 DIAGNOSIS — J45909 Unspecified asthma, uncomplicated: Secondary | ICD-10-CM | POA: Diagnosis not present

## 2018-06-26 DIAGNOSIS — I10 Essential (primary) hypertension: Secondary | ICD-10-CM | POA: Diagnosis not present

## 2018-06-26 DIAGNOSIS — E1140 Type 2 diabetes mellitus with diabetic neuropathy, unspecified: Secondary | ICD-10-CM | POA: Diagnosis not present

## 2018-06-26 DIAGNOSIS — Z1339 Encounter for screening examination for other mental health and behavioral disorders: Secondary | ICD-10-CM | POA: Diagnosis not present

## 2018-06-26 DIAGNOSIS — K589 Irritable bowel syndrome without diarrhea: Secondary | ICD-10-CM | POA: Diagnosis not present

## 2018-06-26 DIAGNOSIS — Z23 Encounter for immunization: Secondary | ICD-10-CM | POA: Diagnosis not present

## 2018-06-26 DIAGNOSIS — E669 Obesity, unspecified: Secondary | ICD-10-CM | POA: Diagnosis not present

## 2018-06-26 DIAGNOSIS — M159 Polyosteoarthritis, unspecified: Secondary | ICD-10-CM | POA: Diagnosis not present

## 2018-06-26 DIAGNOSIS — N183 Chronic kidney disease, stage 3 (moderate): Secondary | ICD-10-CM | POA: Diagnosis not present

## 2018-06-26 DIAGNOSIS — K219 Gastro-esophageal reflux disease without esophagitis: Secondary | ICD-10-CM | POA: Diagnosis not present

## 2018-07-18 DIAGNOSIS — K589 Irritable bowel syndrome without diarrhea: Secondary | ICD-10-CM | POA: Diagnosis not present

## 2018-07-18 DIAGNOSIS — E118 Type 2 diabetes mellitus with unspecified complications: Secondary | ICD-10-CM | POA: Diagnosis not present

## 2018-07-18 DIAGNOSIS — J45909 Unspecified asthma, uncomplicated: Secondary | ICD-10-CM | POA: Diagnosis not present

## 2018-07-18 DIAGNOSIS — M159 Polyosteoarthritis, unspecified: Secondary | ICD-10-CM | POA: Diagnosis not present

## 2018-07-18 DIAGNOSIS — N183 Chronic kidney disease, stage 3 (moderate): Secondary | ICD-10-CM | POA: Diagnosis not present

## 2018-07-18 DIAGNOSIS — E7800 Pure hypercholesterolemia, unspecified: Secondary | ICD-10-CM | POA: Diagnosis not present

## 2018-07-18 DIAGNOSIS — E1140 Type 2 diabetes mellitus with diabetic neuropathy, unspecified: Secondary | ICD-10-CM | POA: Diagnosis not present

## 2018-07-18 DIAGNOSIS — K219 Gastro-esophageal reflux disease without esophagitis: Secondary | ICD-10-CM | POA: Diagnosis not present

## 2018-07-18 DIAGNOSIS — I10 Essential (primary) hypertension: Secondary | ICD-10-CM | POA: Diagnosis not present

## 2018-08-04 DIAGNOSIS — E119 Type 2 diabetes mellitus without complications: Secondary | ICD-10-CM | POA: Diagnosis not present

## 2018-08-06 DIAGNOSIS — R69 Illness, unspecified: Secondary | ICD-10-CM | POA: Diagnosis not present

## 2018-08-18 DIAGNOSIS — M25561 Pain in right knee: Secondary | ICD-10-CM | POA: Diagnosis not present

## 2018-09-03 DIAGNOSIS — Z6838 Body mass index (BMI) 38.0-38.9, adult: Secondary | ICD-10-CM | POA: Diagnosis not present

## 2018-09-03 DIAGNOSIS — M79604 Pain in right leg: Secondary | ICD-10-CM | POA: Diagnosis not present

## 2018-09-15 DIAGNOSIS — J45909 Unspecified asthma, uncomplicated: Secondary | ICD-10-CM | POA: Diagnosis not present

## 2018-09-15 DIAGNOSIS — E7800 Pure hypercholesterolemia, unspecified: Secondary | ICD-10-CM | POA: Diagnosis not present

## 2018-09-15 DIAGNOSIS — E1140 Type 2 diabetes mellitus with diabetic neuropathy, unspecified: Secondary | ICD-10-CM | POA: Diagnosis not present

## 2018-09-15 DIAGNOSIS — K589 Irritable bowel syndrome without diarrhea: Secondary | ICD-10-CM | POA: Diagnosis not present

## 2018-09-15 DIAGNOSIS — I10 Essential (primary) hypertension: Secondary | ICD-10-CM | POA: Diagnosis not present

## 2018-09-15 DIAGNOSIS — M159 Polyosteoarthritis, unspecified: Secondary | ICD-10-CM | POA: Diagnosis not present

## 2018-09-15 DIAGNOSIS — K219 Gastro-esophageal reflux disease without esophagitis: Secondary | ICD-10-CM | POA: Diagnosis not present

## 2018-09-15 DIAGNOSIS — J208 Acute bronchitis due to other specified organisms: Secondary | ICD-10-CM | POA: Diagnosis not present

## 2018-09-15 DIAGNOSIS — N183 Chronic kidney disease, stage 3 (moderate): Secondary | ICD-10-CM | POA: Diagnosis not present

## 2018-09-17 DIAGNOSIS — E119 Type 2 diabetes mellitus without complications: Secondary | ICD-10-CM | POA: Diagnosis not present

## 2018-09-17 DIAGNOSIS — Z961 Presence of intraocular lens: Secondary | ICD-10-CM | POA: Diagnosis not present

## 2018-09-25 DIAGNOSIS — M159 Polyosteoarthritis, unspecified: Secondary | ICD-10-CM | POA: Diagnosis not present

## 2018-09-25 DIAGNOSIS — N183 Chronic kidney disease, stage 3 (moderate): Secondary | ICD-10-CM | POA: Diagnosis not present

## 2018-09-25 DIAGNOSIS — J208 Acute bronchitis due to other specified organisms: Secondary | ICD-10-CM | POA: Diagnosis not present

## 2018-09-25 DIAGNOSIS — K219 Gastro-esophageal reflux disease without esophagitis: Secondary | ICD-10-CM | POA: Diagnosis not present

## 2018-09-25 DIAGNOSIS — K589 Irritable bowel syndrome without diarrhea: Secondary | ICD-10-CM | POA: Diagnosis not present

## 2018-09-25 DIAGNOSIS — I10 Essential (primary) hypertension: Secondary | ICD-10-CM | POA: Diagnosis not present

## 2018-09-25 DIAGNOSIS — E1140 Type 2 diabetes mellitus with diabetic neuropathy, unspecified: Secondary | ICD-10-CM | POA: Diagnosis not present

## 2018-09-25 DIAGNOSIS — E7800 Pure hypercholesterolemia, unspecified: Secondary | ICD-10-CM | POA: Diagnosis not present

## 2018-09-25 DIAGNOSIS — J45909 Unspecified asthma, uncomplicated: Secondary | ICD-10-CM | POA: Diagnosis not present

## 2018-10-10 DIAGNOSIS — J209 Acute bronchitis, unspecified: Secondary | ICD-10-CM | POA: Diagnosis not present

## 2018-10-21 DIAGNOSIS — I10 Essential (primary) hypertension: Secondary | ICD-10-CM | POA: Diagnosis not present

## 2018-10-21 DIAGNOSIS — Z9181 History of falling: Secondary | ICD-10-CM | POA: Diagnosis not present

## 2018-10-21 DIAGNOSIS — N183 Chronic kidney disease, stage 3 (moderate): Secondary | ICD-10-CM | POA: Diagnosis not present

## 2018-10-21 DIAGNOSIS — E7800 Pure hypercholesterolemia, unspecified: Secondary | ICD-10-CM | POA: Diagnosis not present

## 2018-10-21 DIAGNOSIS — E118 Type 2 diabetes mellitus with unspecified complications: Secondary | ICD-10-CM | POA: Diagnosis not present

## 2018-10-21 DIAGNOSIS — J208 Acute bronchitis due to other specified organisms: Secondary | ICD-10-CM | POA: Diagnosis not present

## 2018-11-05 DIAGNOSIS — J208 Acute bronchitis due to other specified organisms: Secondary | ICD-10-CM | POA: Diagnosis not present

## 2018-11-05 DIAGNOSIS — E1140 Type 2 diabetes mellitus with diabetic neuropathy, unspecified: Secondary | ICD-10-CM | POA: Diagnosis not present

## 2018-11-05 DIAGNOSIS — K219 Gastro-esophageal reflux disease without esophagitis: Secondary | ICD-10-CM | POA: Diagnosis not present

## 2018-11-05 DIAGNOSIS — N183 Chronic kidney disease, stage 3 (moderate): Secondary | ICD-10-CM | POA: Diagnosis not present

## 2018-11-05 DIAGNOSIS — K589 Irritable bowel syndrome without diarrhea: Secondary | ICD-10-CM | POA: Diagnosis not present

## 2018-11-13 DIAGNOSIS — K589 Irritable bowel syndrome without diarrhea: Secondary | ICD-10-CM | POA: Diagnosis not present

## 2018-11-13 DIAGNOSIS — E1140 Type 2 diabetes mellitus with diabetic neuropathy, unspecified: Secondary | ICD-10-CM | POA: Diagnosis not present

## 2018-11-13 DIAGNOSIS — N183 Chronic kidney disease, stage 3 (moderate): Secondary | ICD-10-CM | POA: Diagnosis not present

## 2018-11-13 DIAGNOSIS — R05 Cough: Secondary | ICD-10-CM | POA: Diagnosis not present

## 2018-11-13 DIAGNOSIS — K219 Gastro-esophageal reflux disease without esophagitis: Secondary | ICD-10-CM | POA: Diagnosis not present

## 2018-11-27 ENCOUNTER — Ambulatory Visit: Payer: Medicare Other | Admitting: Pulmonary Disease

## 2018-11-27 ENCOUNTER — Encounter: Payer: Self-pay | Admitting: Pulmonary Disease

## 2018-11-27 VITALS — BP 132/78 | HR 94 | Ht 64.0 in | Wt 208.0 lb

## 2018-11-27 DIAGNOSIS — I10 Essential (primary) hypertension: Secondary | ICD-10-CM | POA: Insufficient documentation

## 2018-11-27 DIAGNOSIS — R05 Cough: Secondary | ICD-10-CM | POA: Diagnosis not present

## 2018-11-27 DIAGNOSIS — E119 Type 2 diabetes mellitus without complications: Secondary | ICD-10-CM | POA: Insufficient documentation

## 2018-11-27 DIAGNOSIS — R053 Chronic cough: Secondary | ICD-10-CM

## 2018-11-27 MED ORDER — BENZONATATE 100 MG PO CAPS: 100.0000 mg | ORAL_CAPSULE | Freq: Three times a day (TID) | ORAL | 1 refills | Status: DC | PRN

## 2018-11-27 MED ORDER — ALBUTEROL SULFATE HFA 108 (90 BASE) MCG/ACT IN AERS: 2.0000 | INHALATION_SPRAY | Freq: Four times a day (QID) | RESPIRATORY_TRACT | 6 refills | Status: DC | PRN

## 2018-11-27 MED ORDER — HYDROCODONE-HOMATROPINE 5-1.5 MG/5ML PO SYRP: 5.0000 mL | ORAL_SOLUTION | Freq: Four times a day (QID) | ORAL | 0 refills | Status: DC | PRN

## 2018-11-27 NOTE — Progress Notes (Signed)
Subjective:   PATIENT ID: Tanya Munoz GENDER: female DOB: December 10, 1940, MRN: [id number]   HPI  Chief Complaint  Patient presents with  . Consult    Referrred by Dr. Truman Hayward. She has been seen in urgent care frequently for Bronchitis. States she is having increased SOB and dry cough.     Reason for Visit: New consult for chronic cough  Tanya Munoz is a 78 year old female with hypertension, diabetes and hyperlipidemia who presents with chronic cough.  She reports nonproductive cough since Christmas (8 weeks) that has been previously diagnosed as bronchitis in urgent care by her PCP and has been treated with 2 courses of doxycycline.  No steroids.  Has also tried Hycodan and [employer] with some relief but has recurrent symptoms when she stops the medications.  Cough is associated with dyspnea secondary to spasmodic coughing episodes and fatigue.  Occurs throughout the day and night.  Occasionally productive with clear sputum. Denies wheezing.  Has some nausea but no posttussive emesis.  Sometimes has nasal and chest congestion but this is not always associated with cough.  She is on losartan for her blood pressure.  Denies reflux symptoms.  Her cough prevents her from being able to enjoy church service, causing her to leave at times.  Social History: 1/4 ppd x 1 year. Quit in 1987.  Environmental exposures: Previously worked in [employer] x30 years.  Retired  I have personally reviewed patient's past medical/family/social history, allergies, current medications.  Past Medical History:  Diagnosis Date  . 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 Chronic kidney disease    uti about a month ago - treated with abx  . Diabetes mellitus    diagnosed 6-7 yrs ago - takes metformin  . GERD (gastroesophageal reflux disease)    h/o acid reflux - took zantac  . Hyperlipidemia   . Hypertension    saw a cardiologist in [place] for surgical clearance     No  family history on file.   Social History   Occupational History  . Not on file  Tobacco Use  . Smoking status: Former Smoker    Packs/day: 0.25    Years: 1.00    Pack years: 0.25    Types: Cigarettes    Last attempt to quit: 08/12/1986    Years since quitting: 32.3  . Smokeless tobacco: Never Used  Substance and Sexual Activity  . Alcohol use: No  . Drug use: No  . Sexual activity: Not on file    No Known Allergies   Outpatient Medications Prior to Visit  Medication Sig Dispense Refill  . aspirin 81 MG tablet Take 81 mg by mouth daily.      . benzonatate (TESSALON) 100 MG capsule Take 100 mg by mouth 3 (three) times daily as needed for cough.    . carvedilol (COREG) 3.125 MG tablet Take 3.125 mg by mouth 2 (two) times daily with a meal.      . losartan-hydrochlorothiazide (HYZAAR) 100-25 MG per tablet Take 1 tablet by mouth daily.      Marland Kitchen lovastatin (MEVACOR) 40 MG tablet Take 40 mg by mouth at bedtime.      . metFORMIN (GLUCOPHAGE) 850 MG tablet Take 850 mg by mouth 2 (two) times daily with a meal.      . montelukast (SINGULAIR) 10 MG tablet Take 10 mg by mouth at bedtime.    . traMADol (ULTRAM) 50 MG tablet Take 50 mg by  mouth every 6 (six) hours as needed. Maximum dose= 8 tablets per day For pain     . warfarin (COUMADIN) 5 MG tablet Take 1 tablet (5 mg total) by mouth daily. As directed per home health pharmacy 40 tablet 0  . Ferrous Sulfate (IRON) 325 (65 FE) MG TABS Take 1 tablet by mouth 2 (two) times daily. (Patient not taking: Reported on 11/27/2018) 30 each 0  . GARLIC PO Take 1 tablet by mouth daily.      Marland Kitchen omega-3 acid ethyl esters (LOVAZA) 1 G capsule Take 1 g by mouth daily.       No facility-administered medications prior to visit.     Review of Systems  Constitutional: Positive for malaise/fatigue. Negative for chills, diaphoresis, fever and weight loss.  HENT: Negative for congestion, ear pain and sore throat.   Respiratory: Positive for cough, sputum  production and shortness of breath. Negative for hemoptysis and wheezing.   Cardiovascular: Negative for chest pain, palpitations and leg swelling.  Gastrointestinal: Negative for abdominal pain, heartburn and nausea.  Genitourinary: Negative for frequency.  Musculoskeletal: Negative for joint pain and myalgias.  Skin: Negative for itching and rash.  Neurological: Negative for dizziness, weakness and headaches.  Endo/Heme/Allergies: Does not bruise/bleed easily.  Psychiatric/Behavioral: Negative for depression. The patient is not nervous/anxious.      Objective:   Vitals:   11/27/18 0958  BP: 132/78  Pulse: 94  SpO2: 98%  Weight: 208 lb (94.3 kg)  Height: 5\' 4"  (1.626 m)   SpO2: 98 %  Physical Exam: General: Well-appearing, no acute distress HENT: [place], AT, OP clear, MMM Eyes: EOMI, no scleral icterus Respiratory: Clear to auscultation bilaterally.  No crackles, wheezing or rales Cardiovascular: RRR, -M/R/G, no JVD GI: BS+, soft, nontender Extremities:-Edema,-tenderness Neuro: AAO x4, CNII-XII grossly intact Skin: Intact, no rashes or bruising Psych: Normal mood, normal affect  Data Reviewed:  Imaging: CXR 08/17/11-No acute cardiopulmonary abnormalities  PFT: None on file  Labs:  Imaging, labs and tests noted above have been reviewed independently by me.    Assessment & Plan:   Discussion: 77 year old female with hypertension, diabetes and hyperlipidemia who presents with chronic cough.  Chronic cough -START Hycodan (cough suppressant) -START Albuterol inhaler 2 puffs every 4 hours as needed for shortness of breath or wheezing -Pulmonary function test will be ordered prior to next visit -Will obtain records from PCP   Health Maintenance Pneumonia - Per PCP in [place] Influenza 07/2018  Orders Placed This Encounter  Procedures  . Pulmonary function test    Standing Status:   Future    Standing Expiration Date:   11/28/2019    Order Specific Question:    Where should this test be performed?    Answer:   [facility]    Order Specific Question:   Full PFT: includes the following: basic spirometry, spirometry pre & post bronchodilator, diffusion capacity (DLCO), lung volumes    Answer:   Full PFT   Meds ordered this encounter  Medications  . albuterol (PROVENTIL HFA;VENTOLIN HFA) 108 (90 Base) MCG/ACT inhaler    Sig: Inhale 2 puffs into the lungs every 6 (six) hours as needed for wheezing or shortness of breath.    Dispense:  1 Inhaler    Refill:  6  . DISCONTD: HYDROcodone-homatropine (HYCODAN) 5-1.5 MG/5ML syrup    Sig: Take 5 mLs by mouth every 6 (six) hours as needed for cough.    Dispense:  240 mL    Refill:  0  .  HYDROcodone-homatropine (HYCODAN) 5-1.5 MG/5ML syrup    Sig: Take 5 mLs by mouth every 6 (six) hours as needed for cough.    Dispense:  240 mL    Refill:  0  . benzonatate (TESSALON) 100 MG capsule    Sig: Take 1 capsule (100 mg total) by mouth 3 (three) times daily as needed for cough.    Dispense:  60 capsule    Refill:  1    Return in about 2 weeks (around 12/11/2018).  [place], MD [facility] 11/27/2018 10:51 AM  Office Number [phone]

## 2018-11-27 NOTE — Patient Instructions (Addendum)
Chronic cough -START Hycodan (cough suppressant) -START Albuterol inhaler 2 puffs every 4 hours as needed for shortness of breath or wheezing -Pulmonary function test will be ordered prior to next visit

## 2018-12-03 DIAGNOSIS — Z1231 Encounter for screening mammogram for malignant neoplasm of breast: Secondary | ICD-10-CM | POA: Diagnosis not present

## 2018-12-15 ENCOUNTER — Encounter: Payer: Self-pay | Admitting: Primary Care

## 2018-12-15 ENCOUNTER — Other Ambulatory Visit: Payer: Self-pay

## 2018-12-15 ENCOUNTER — Ambulatory Visit (INDEPENDENT_AMBULATORY_CARE_PROVIDER_SITE_OTHER): Payer: Medicare Other | Admitting: Primary Care

## 2018-12-15 VITALS — BP 132/66 | HR 69 | Ht 64.0 in | Wt 209.0 lb

## 2018-12-15 DIAGNOSIS — R05 Cough: Secondary | ICD-10-CM | POA: Diagnosis not present

## 2018-12-15 DIAGNOSIS — R0609 Other forms of dyspnea: Secondary | ICD-10-CM | POA: Insufficient documentation

## 2018-12-15 DIAGNOSIS — R0600 Dyspnea, unspecified: Secondary | ICD-10-CM | POA: Insufficient documentation

## 2018-12-15 DIAGNOSIS — R053 Chronic cough: Secondary | ICD-10-CM

## 2018-12-15 NOTE — Progress Notes (Signed)
@Patient  ID: Tanya Munoz, female    DOB: 1941-09-19, 78 y.o.   MRN: [id number]  Chief Complaint  Patient presents with  . Follow-up    stoppped coughing, SOB with exertion    Referring provider: No ref. provider found  HPI: 78 year old female, former smoker quit in 1987 (0.25 pack hx). PMH hypertension, diabetes, hyperlipidemia. Patient of Dr. Loanne Drilling, seen for initial consult on 2/27 for chronic cough. Treated with two courses of Doxycyline for bronchitis symptoms since December. 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 She had not tried steroids. Ordered for PFTs, started on Albuterol prn.   12/15/2018 Patient presents today for 1 month follow-up, unfortunately patient was late and missed her PFT apt. Reports that her cough has resolved, continues to have some dyspnea with activity since December. States that she is fine walking down grocery aisle. More short of breath with stairs/hills. Using hycodan cough syrup twice a day. Denies nasal congestion, weight gain or leg swelling.   No Known Allergies  Immunization History  Administered Date(s) Administered  . Influenza, High Dose Seasonal PF 07/27/2018    Past Medical History:  Diagnosis Date  . 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 Chronic kidney disease    uti about a month ago - treated with abx  . Diabetes mellitus    diagnosed 6-7 yrs ago - takes metformin  . GERD (gastroesophageal reflux disease)    h/o acid reflux - took zantac  . Hyperlipidemia   . Hypertension    saw a cardiologist in [place] for surgical clearance    Tobacco History: Social History   Tobacco Use  Smoking Status Former Smoker  . Packs/day: 0.25  . Years: 1.00  . Pack years: 0.25  . Types: Cigarettes  . Last attempt to quit: 08/12/1986  . Years since quitting: 32.3  Smokeless Tobacco Never Used   Counseling given: Not Answered   Outpatient Medications Prior to Visit  Medication Sig Dispense Refill  . albuterol  (PROVENTIL HFA;VENTOLIN HFA) 108 (90 Base) MCG/ACT inhaler Inhale 2 puffs into the lungs every 6 (six) hours as needed for wheezing or shortness of breath. 1 Inhaler 6  . aspirin 81 MG tablet Take 81 mg by mouth daily.      . benzonatate (TESSALON) 100 MG capsule Take 1 capsule (100 mg total) by mouth 3 (three) times daily as needed for cough. 60 capsule 1  . carvedilol (COREG) 3.125 MG tablet Take 3.125 mg by mouth 2 (two) times daily with a meal.      . losartan-hydrochlorothiazide (HYZAAR) 100-25 MG per tablet Take 1 tablet by mouth daily.      Marland Kitchen lovastatin (MEVACOR) 40 MG tablet Take 40 mg by mouth at bedtime.      . metFORMIN (GLUCOPHAGE) 850 MG tablet Take 850 mg by mouth 2 (two) times daily with a meal.      . montelukast (SINGULAIR) 10 MG tablet Take 10 mg by mouth at bedtime.    . traMADol (ULTRAM) 50 MG tablet Take 50 mg by mouth every 6 (six) hours as needed. Maximum dose= 8 tablets per day For pain     . benzonatate (TESSALON) 100 MG capsule Take 100 mg by mouth 3 (three) times daily as needed for cough.    Marland Kitchen HYDROcodone-homatropine (HYCODAN) 5-1.5 MG/5ML syrup Take 5 mLs by mouth every 6 (six) hours as needed for cough. 240 mL 0  . warfarin (COUMADIN) 5 MG tablet Take 1 tablet (  5 mg total) by mouth daily. As directed per home health pharmacy 40 tablet 0   No facility-administered medications prior to visit.     Review of Systems  Review of Systems  Constitutional: Negative.   HENT: Negative.   Respiratory: Positive for shortness of breath. Negative for cough and wheezing.   Cardiovascular: Negative.     Physical Exam  BP 132/66 (BP Location: Left Arm, Cuff Size: Normal)   Pulse 69   Ht 5\' 4"  (1.626 m)   Wt 209 lb (94.8 kg)   SpO2 100%   BMI 35.87 kg/m  Physical Exam Constitutional:      Appearance: Normal appearance.  HENT:     Head: Normocephalic and atraumatic.     Right Ear: Tympanic membrane normal.     Left Ear: Tympanic membrane normal.     Nose: Nose  normal.     Mouth/Throat:     Mouth: Mucous membranes are moist.     Pharynx: Oropharynx is clear.  Eyes:     Extraocular Movements: Extraocular movements intact.     Pupils: Pupils are equal, round, and reactive to light.  Neck:     Musculoskeletal: Normal range of motion and neck supple.  Cardiovascular:     Rate and Rhythm: Normal rate and regular rhythm.     Heart sounds: Normal heart sounds.  Pulmonary:     Effort: Pulmonary effort is normal. No respiratory distress.     Breath sounds: Normal breath sounds. No stridor. No wheezing or rhonchi.  Musculoskeletal: Normal range of motion.  Skin:    General: Skin is warm and dry.  Neurological:     General: No focal deficit present.     Mental Status: She is alert and oriented to person, place, and time. Mental status is at baseline.  Psychiatric:        Mood and Affect: Mood normal.        Behavior: Behavior normal.        Thought Content: Thought content normal.        Judgment: Judgment normal.      Lab Results:  CBC    Component Value Date/Time   WBC 6.4 08/20/2011 0610   RBC 3.32 (L) 08/20/2011 0610   HGB 9.5 (L) 08/20/2011 0610   HCT 30.0 (L) 08/20/2011 0610   PLT 240 08/20/2011 0610   MCV 90.4 08/20/2011 0610   MCH 28.6 08/20/2011 0610   MCHC 31.7 08/20/2011 0610   RDW 13.2 08/20/2011 0610   LYMPHSABS 1.3 08/17/2011 1638   MONOABS 0.7 08/17/2011 1638   EOSABS 0.1 08/17/2011 1638   BASOSABS 0.0 08/17/2011 1638    BMET    Component Value Date/Time   NA 134 (L) 08/20/2011 0610   K 4.0 08/20/2011 0610   CL 98 08/20/2011 0610   CO2 26 08/20/2011 0610   GLUCOSE 128 (H) 08/20/2011 0610   BUN 17 08/20/2011 0610   CREATININE 0.84 08/20/2011 0610   CALCIUM 9.1 08/20/2011 0610   GFRNONAA 68 (L) 08/20/2011 0610   GFRAA 79 (L) 08/20/2011 0610    BNP No results found for: BNP  ProBNP No results found for: PROBNP  Imaging: No results found.   Assessment & Plan:   Chronic cough - Cough has resolved  - Encouraged patient start tapering off hycodan cough syrup and tramadol  Dyspnea on exertion - Mild dyspnea symptoms since bronchitis in Dec 2019 - Spirometry normal today, no evidence of COPD - Encourage patient maintain a healthy weight  and stay physically active - If continues to have dyspnea would consider further imaging and/or recommend cardiology work-up   FU in 6 months with Dr. Loanne Drilling or sooner if symptoms return/worsen   Martyn Ehrich, NP 12/15/2018

## 2018-12-15 NOTE — Patient Instructions (Addendum)
Glad you are feeling better, start tapering off cough medication   Spirometry was normal  Work on staying active and physical conditioning Keep weight stable   If you continue to have shortness of breath please follow up with PCP or Dr. Loanne Drilling, may need to consider further imaging and/or cardiology work up   FU with Dr. Loanne Drilling in 6 months

## 2018-12-15 NOTE — Assessment & Plan Note (Addendum)
-   Cough has resolved - Encouraged patient start tapering off hycodan cough syrup and tramadol

## 2018-12-15 NOTE — Assessment & Plan Note (Addendum)
-   Mild dyspnea symptoms since bronchitis in Dec 2019 - Spirometry normal today, no evidence of COPD - Encourage patient maintain a healthy weight and stay physically active - If continues to have dyspnea would consider further imaging and/or recommend cardiology work-up

## 2019-01-20 DIAGNOSIS — N183 Chronic kidney disease, stage 3 (moderate): Secondary | ICD-10-CM | POA: Diagnosis not present

## 2019-01-20 DIAGNOSIS — K219 Gastro-esophageal reflux disease without esophagitis: Secondary | ICD-10-CM | POA: Diagnosis not present

## 2019-01-20 DIAGNOSIS — E1140 Type 2 diabetes mellitus with diabetic neuropathy, unspecified: Secondary | ICD-10-CM | POA: Diagnosis not present

## 2019-01-20 DIAGNOSIS — Z139 Encounter for screening, unspecified: Secondary | ICD-10-CM | POA: Diagnosis not present

## 2019-01-20 DIAGNOSIS — K589 Irritable bowel syndrome without diarrhea: Secondary | ICD-10-CM | POA: Diagnosis not present

## 2019-01-20 DIAGNOSIS — E118 Type 2 diabetes mellitus with unspecified complications: Secondary | ICD-10-CM | POA: Diagnosis not present

## 2019-01-20 DIAGNOSIS — E7800 Pure hypercholesterolemia, unspecified: Secondary | ICD-10-CM | POA: Diagnosis not present

## 2019-01-20 DIAGNOSIS — I10 Essential (primary) hypertension: Secondary | ICD-10-CM | POA: Diagnosis not present

## 2019-03-06 DIAGNOSIS — R0781 Pleurodynia: Secondary | ICD-10-CM | POA: Diagnosis not present

## 2019-03-18 DIAGNOSIS — N183 Chronic kidney disease, stage 3 (moderate): Secondary | ICD-10-CM | POA: Diagnosis not present

## 2019-03-18 DIAGNOSIS — K589 Irritable bowel syndrome without diarrhea: Secondary | ICD-10-CM | POA: Diagnosis not present

## 2019-03-18 DIAGNOSIS — J45909 Unspecified asthma, uncomplicated: Secondary | ICD-10-CM | POA: Diagnosis not present

## 2019-03-18 DIAGNOSIS — E1140 Type 2 diabetes mellitus with diabetic neuropathy, unspecified: Secondary | ICD-10-CM | POA: Diagnosis not present

## 2019-03-18 DIAGNOSIS — K219 Gastro-esophageal reflux disease without esophagitis: Secondary | ICD-10-CM | POA: Diagnosis not present

## 2019-04-21 DIAGNOSIS — E7800 Pure hypercholesterolemia, unspecified: Secondary | ICD-10-CM | POA: Diagnosis not present

## 2019-04-21 DIAGNOSIS — J45909 Unspecified asthma, uncomplicated: Secondary | ICD-10-CM | POA: Diagnosis not present

## 2019-04-21 DIAGNOSIS — E118 Type 2 diabetes mellitus with unspecified complications: Secondary | ICD-10-CM | POA: Diagnosis not present

## 2019-04-21 DIAGNOSIS — N183 Chronic kidney disease, stage 3 (moderate): Secondary | ICD-10-CM | POA: Diagnosis not present

## 2019-04-21 DIAGNOSIS — K219 Gastro-esophageal reflux disease without esophagitis: Secondary | ICD-10-CM | POA: Diagnosis not present

## 2019-04-21 DIAGNOSIS — I10 Essential (primary) hypertension: Secondary | ICD-10-CM | POA: Diagnosis not present

## 2019-04-21 DIAGNOSIS — E1140 Type 2 diabetes mellitus with diabetic neuropathy, unspecified: Secondary | ICD-10-CM | POA: Diagnosis not present

## 2019-04-21 DIAGNOSIS — K589 Irritable bowel syndrome without diarrhea: Secondary | ICD-10-CM | POA: Diagnosis not present

## 2019-04-22 DIAGNOSIS — Z0000 Encounter for general adult medical examination without abnormal findings: Secondary | ICD-10-CM | POA: Diagnosis not present

## 2019-04-22 DIAGNOSIS — Z9181 History of falling: Secondary | ICD-10-CM | POA: Diagnosis not present

## 2019-04-22 DIAGNOSIS — E785 Hyperlipidemia, unspecified: Secondary | ICD-10-CM | POA: Diagnosis not present

## 2019-05-13 ENCOUNTER — Encounter: Payer: Self-pay | Admitting: Pulmonary Disease

## 2019-06-18 ENCOUNTER — Encounter: Payer: Self-pay | Admitting: Pulmonary Disease

## 2019-06-18 ENCOUNTER — Other Ambulatory Visit: Payer: Self-pay

## 2019-06-18 ENCOUNTER — Ambulatory Visit: Payer: Medicare Other | Admitting: Pulmonary Disease

## 2019-06-18 VITALS — BP 132/56 | HR 52 | Temp 98.1°F | Ht 64.0 in | Wt 199.2 lb

## 2019-06-18 DIAGNOSIS — R5383 Other fatigue: Secondary | ICD-10-CM | POA: Diagnosis not present

## 2019-06-18 DIAGNOSIS — Z23 Encounter for immunization: Secondary | ICD-10-CM

## 2019-06-18 DIAGNOSIS — R0602 Shortness of breath: Secondary | ICD-10-CM

## 2019-06-18 NOTE — Patient Instructions (Addendum)
Dyspnea (Shortness of breath) We will further evaluate to rule out obstructive sleep apnea with home sleep study Discuss Cardiology referral with PCP   Follow-up with me in 3 months

## 2019-06-18 NOTE — Progress Notes (Signed)
Subjective:   PATIENT ID: Tanya Munoz GENDER: female DOB: 1941/01/21, MRN: [id number]   HPI  Chief Complaint  Patient presents with  . Follow-up    cough has decreased    Reason for Visit: Follow-up for dyspnea on exertion  Tanya Munoz is an 78 year old female remote former smoker with HTN, DM, HLD who presents for follow-up.  She reports she is overall doing. Since our last visit, her cough has completely resolved. She has concerns regarding gradually worsening dyspnea on exertion with short distances and long periods of standing in the last 6 months. Denies wheezing. [facility] she has had to care for her husband who has been diagnosed with prostate and pancreatic cancer in the last year. She is not active at baseline due to bilateral knee pain s/p replacement in 2006 and 2011. She is working at losing weight .   Denies morning headaches. Reports fatigue. She does snore. She says she was diagnosed with sleep apnea 15 years ago. She also used to wear oxygen at night but not sure why.  Social History: 1/4 ppd x 1 year. Quit in 1987.  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  Past Medical History:  Diagnosis Date  . 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 Chronic kidney disease    uti about a month ago - treated with abx  . Diabetes mellitus    diagnosed 6-7 yrs ago - takes metformin  . GERD (gastroesophageal reflux disease)    h/o acid reflux - took zantac  . Hyperlipidemia   . Hypertension    saw a cardiologist in [place] for surgical clearance     Social History   Occupational History  . Not on file  Tobacco Use  . Smoking status: Former Smoker    Packs/day: 0.25    Years: 30.00    Pack years: 7.50    Types: Cigarettes    Start date: 75    Quit date: 08/12/1986   Years since quitting: 32.8  . Smokeless tobacco: Never Used  Substance and Sexual Activity  . Alcohol use: No  . Drug use: No  . Sexual activity: Not on file    No Known Allergies   Outpatient Medications Prior to Visit  Medication Sig Dispense Refill  . albuterol (PROVENTIL HFA;VENTOLIN HFA) 108 (90 Base) MCG/ACT inhaler Inhale 2 puffs into the lungs every 6 (six) hours as needed for wheezing or shortness of breath. 1 Inhaler 6  . aspirin 81 MG tablet Take 81 mg by mouth daily.      . carvedilol (COREG) 3.125 MG tablet Take 3.125 mg by mouth 2 (two) times daily with a meal.      . losartan-hydrochlorothiazide (HYZAAR) 100-25 MG per tablet Take 1 tablet by mouth daily.      Marland Kitchen lovastatin (MEVACOR) 40 MG tablet Take 40 mg by mouth at bedtime.      . metFORMIN (GLUCOPHAGE) 850 MG tablet Take 850 mg by mouth 2 (two) times daily with a meal.      . montelukast (SINGULAIR) 10 MG tablet Take 10 mg by mouth at bedtime.    . traMADol (ULTRAM) 50 MG tablet Take 50 mg by mouth every 6 (six) hours as needed. Maximum dose= 8 tablets per day For pain     . benzonatate (TESSALON) 100 MG capsule  Take 1 capsule (100 mg total) by mouth 3 (three) times daily as needed for cough. (Patient not taking: Reported on 06/18/2019) 60 capsule 1  . warfarin (COUMADIN) 5 MG tablet Take 1 tablet (5 mg total) by mouth daily. As directed per home health pharmacy 40 tablet 0   No facility-administered medications prior to visit.     Review of Systems  Constitutional: Positive for malaise/fatigue. Negative for chills, diaphoresis, fever and weight loss.  HENT: Negative for congestion, ear pain and sore throat.   Respiratory: Positive for shortness of breath. Negative for cough, hemoptysis, sputum production and wheezing.   Cardiovascular: Negative for chest pain, palpitations and leg swelling.  Gastrointestinal: Negative for abdominal pain, heartburn and nausea.  Genitourinary: Negative for frequency.   Musculoskeletal: Negative for joint pain and myalgias.  Skin: Negative for itching and rash.  Neurological: Negative for dizziness, weakness and headaches.  Endo/Heme/Allergies: Does not bruise/bleed easily.  Psychiatric/Behavioral: Negative for depression. The patient is not nervous/anxious.      Objective:   Vitals:   06/18/19 0906 06/18/19 0908  BP:  (!) 132/56  Pulse:  (!) 52  Temp: 98.1 F (36.7 C)   TempSrc: Temporal   SpO2:  97%  Weight: 199 lb 3.2 oz (90.4 kg)   Height: 5\' 4"  (1.626 m)    SpO2: 97 % O2 Device: None (Room air)  Physical Exam: General: Well-appearing, no acute distress HENT: [place], AT, OP clear, MMM Eyes: EOMI, no scleral icterus Respiratory: Clear to auscultation bilaterally.  No crackles, wheezing or rales Cardiovascular: RRR, -M/R/G, no JVD GI: BS+, soft, nontender Extremities:-Edema,-tenderness Neuro: AAO x4, CNII-XII grossly intact Skin: Intact, no rashes or bruising Psych: Normal mood, normal affect  Data Reviewed:  Imaging: CXR 08/17/11-No acute cardiopulmonary abnormalities  PFT: Spirometry 12/15/18 FVC 1.7 (85%) FEV1 1.5 (94%) Ratio 85   Interpretation: No evidence of obstructive defect  Labs: CBC    Component Value Date/Time   WBC 6.4 08/20/2011 0610   RBC 3.32 (L) 08/20/2011 0610   HGB 9.5 (L) 08/20/2011 0610   HCT 30.0 (L) 08/20/2011 0610   PLT 240 08/20/2011 0610   MCV 90.4 08/20/2011 0610   MCH 28.6 08/20/2011 0610   MCHC 31.7 08/20/2011 0610   RDW 13.2 08/20/2011 0610   LYMPHSABS 1.3 08/17/2011 1638   MONOABS 0.7 08/17/2011 1638   EOSABS 0.1 08/17/2011 1638   BASOSABS 0.0 08/17/2011 1638   BMET    Component Value Date/Time   NA 134 (L) 08/20/2011 0610   K 4.0 08/20/2011 0610   CL 98 08/20/2011 0610   CO2 26 08/20/2011 0610   GLUCOSE 128 (H) 08/20/2011 0610   BUN 17 08/20/2011 0610   CREATININE 0.84 08/20/2011 0610   CALCIUM 9.1 08/20/2011 0610   GFRNONAA 68 (L) 08/20/2011 0610   GFRAA 79 (L) 08/20/2011 0610    Imaging, labs and test noted above have been reviewed independently by me.    Assessment & Plan:   Discussion: 78 year-old female with HTN, DM2 and HLD who presents who dyspnea on exertion. Her dyspnea may be related to mild deconditioning due to less activity during pandemic and limitation of knee pain. She does have past history of OSA that could be contributing to her dyspnea as well. Will rule out first.  Dyspnea (Shortness of breath) We will further evaluate to rule out obstructive sleep apnea with home sleep study Discuss Cardiology referral with PCP   Health Maintenance Pneumonia - Per PCP in [place] Influenza 07/2018  Orders  Placed This Encounter  Procedures  . Flu Vaccine QUAD High Dose(Fluad)  . Home sleep test    Standing Status:   Future    Standing Expiration Date:   06/17/2020    Order Specific Question:   Where should this test be performed:    Answer:   [facility]   No orders of the defined types were placed in this encounter.   Return in about 3 months (around 09/17/2019).  [place], MD [facility] 06/18/2019 9:32 AM  Office Number [phone]

## 2019-07-02 DIAGNOSIS — E119 Type 2 diabetes mellitus without complications: Secondary | ICD-10-CM | POA: Diagnosis not present

## 2019-07-02 DIAGNOSIS — N3081 Other cystitis with hematuria: Secondary | ICD-10-CM | POA: Diagnosis not present

## 2019-07-15 ENCOUNTER — Other Ambulatory Visit: Payer: Self-pay

## 2019-07-15 ENCOUNTER — Ambulatory Visit: Payer: Medicare Other

## 2019-07-15 DIAGNOSIS — R0602 Shortness of breath: Secondary | ICD-10-CM

## 2019-07-15 DIAGNOSIS — G4733 Obstructive sleep apnea (adult) (pediatric): Secondary | ICD-10-CM | POA: Diagnosis not present

## 2019-07-17 DIAGNOSIS — G4733 Obstructive sleep apnea (adult) (pediatric): Secondary | ICD-10-CM | POA: Diagnosis not present

## 2019-07-22 DIAGNOSIS — I10 Essential (primary) hypertension: Secondary | ICD-10-CM | POA: Diagnosis not present

## 2019-07-22 DIAGNOSIS — M159 Polyosteoarthritis, unspecified: Secondary | ICD-10-CM | POA: Diagnosis not present

## 2019-07-22 DIAGNOSIS — K219 Gastro-esophageal reflux disease without esophagitis: Secondary | ICD-10-CM | POA: Diagnosis not present

## 2019-07-22 DIAGNOSIS — E7800 Pure hypercholesterolemia, unspecified: Secondary | ICD-10-CM | POA: Diagnosis not present

## 2019-07-22 DIAGNOSIS — N183 Chronic kidney disease, stage 3 unspecified: Secondary | ICD-10-CM | POA: Diagnosis not present

## 2019-07-22 DIAGNOSIS — K589 Irritable bowel syndrome without diarrhea: Secondary | ICD-10-CM | POA: Diagnosis not present

## 2019-07-22 DIAGNOSIS — E118 Type 2 diabetes mellitus with unspecified complications: Secondary | ICD-10-CM | POA: Diagnosis not present

## 2019-07-22 DIAGNOSIS — E1140 Type 2 diabetes mellitus with diabetic neuropathy, unspecified: Secondary | ICD-10-CM | POA: Diagnosis not present

## 2019-07-23 DIAGNOSIS — E118 Type 2 diabetes mellitus with unspecified complications: Secondary | ICD-10-CM | POA: Diagnosis not present

## 2019-08-05 DIAGNOSIS — K589 Irritable bowel syndrome without diarrhea: Secondary | ICD-10-CM | POA: Diagnosis not present

## 2019-08-05 DIAGNOSIS — K219 Gastro-esophageal reflux disease without esophagitis: Secondary | ICD-10-CM | POA: Diagnosis not present

## 2019-08-05 DIAGNOSIS — E1140 Type 2 diabetes mellitus with diabetic neuropathy, unspecified: Secondary | ICD-10-CM | POA: Diagnosis not present

## 2019-08-05 DIAGNOSIS — N183 Chronic kidney disease, stage 3 unspecified: Secondary | ICD-10-CM | POA: Diagnosis not present

## 2019-08-05 DIAGNOSIS — M25551 Pain in right hip: Secondary | ICD-10-CM | POA: Diagnosis not present

## 2019-08-15 DIAGNOSIS — E1140 Type 2 diabetes mellitus with diabetic neuropathy, unspecified: Secondary | ICD-10-CM | POA: Diagnosis not present

## 2019-08-15 DIAGNOSIS — K589 Irritable bowel syndrome without diarrhea: Secondary | ICD-10-CM | POA: Diagnosis not present

## 2019-08-15 DIAGNOSIS — K219 Gastro-esophageal reflux disease without esophagitis: Secondary | ICD-10-CM | POA: Diagnosis not present

## 2019-08-15 DIAGNOSIS — N183 Chronic kidney disease, stage 3 unspecified: Secondary | ICD-10-CM | POA: Diagnosis not present

## 2019-08-15 DIAGNOSIS — N390 Urinary tract infection, site not specified: Secondary | ICD-10-CM | POA: Diagnosis not present

## 2019-08-19 DIAGNOSIS — K589 Irritable bowel syndrome without diarrhea: Secondary | ICD-10-CM | POA: Diagnosis not present

## 2019-08-19 DIAGNOSIS — N183 Chronic kidney disease, stage 3 unspecified: Secondary | ICD-10-CM | POA: Diagnosis not present

## 2019-08-19 DIAGNOSIS — E1140 Type 2 diabetes mellitus with diabetic neuropathy, unspecified: Secondary | ICD-10-CM | POA: Diagnosis not present

## 2019-08-19 DIAGNOSIS — J45909 Unspecified asthma, uncomplicated: Secondary | ICD-10-CM | POA: Diagnosis not present

## 2019-08-19 DIAGNOSIS — K219 Gastro-esophageal reflux disease without esophagitis: Secondary | ICD-10-CM | POA: Diagnosis not present

## 2019-09-04 DIAGNOSIS — J45909 Unspecified asthma, uncomplicated: Secondary | ICD-10-CM | POA: Diagnosis not present

## 2019-09-04 DIAGNOSIS — K219 Gastro-esophageal reflux disease without esophagitis: Secondary | ICD-10-CM | POA: Diagnosis not present

## 2019-09-04 DIAGNOSIS — N183 Chronic kidney disease, stage 3 unspecified: Secondary | ICD-10-CM | POA: Diagnosis not present

## 2019-09-04 DIAGNOSIS — E1140 Type 2 diabetes mellitus with diabetic neuropathy, unspecified: Secondary | ICD-10-CM | POA: Diagnosis not present

## 2019-09-04 DIAGNOSIS — K589 Irritable bowel syndrome without diarrhea: Secondary | ICD-10-CM | POA: Diagnosis not present

## 2019-09-15 ENCOUNTER — Telehealth: Payer: Self-pay | Admitting: *Deleted

## 2019-09-15 DIAGNOSIS — G4733 Obstructive sleep apnea (adult) (pediatric): Secondary | ICD-10-CM

## 2019-09-15 NOTE — Telephone Encounter (Signed)
atc patient to follow up if titration was done,  And to make follow up appt with JE

## 2019-09-15 NOTE — Telephone Encounter (Signed)
Returning call to [place] CB# [phone]

## 2019-09-15 NOTE — Telephone Encounter (Signed)
-----   Message from [place], MD sent at 07/17/2019  2:30 PM EDT ----- Please order for PAP titration for moderate OSA. AHI 15.2 with O2 nadir 77%.  Also have patient follow-up with me in 3 months.  JE ----- Message ----- From: Chesley Mires, MD Sent: 07/17/2019   1:10 PM EDT To: Margaretha Seeds, MD  Opal Sidles,  Home sleep study from 07/15/19 shows moderate OSA with AHI 15.2, SpO2 low 77%.   Thanks.   [place]

## 2019-09-17 NOTE — Telephone Encounter (Signed)
Returning call from 09/15/19 CB# [phone]

## 2019-09-17 NOTE — Telephone Encounter (Deleted)
Patient returning your call.

## 2019-09-17 NOTE — Telephone Encounter (Signed)
lmtcb for pt.  

## 2019-10-01 ENCOUNTER — Ambulatory Visit: Payer: Medicare Other | Admitting: Pulmonary Disease

## 2019-10-01 ENCOUNTER — Encounter: Payer: Self-pay | Admitting: Pulmonary Disease

## 2019-10-01 ENCOUNTER — Other Ambulatory Visit: Payer: Self-pay

## 2019-10-01 VITALS — BP 128/70 | HR 68 | Temp 97.4°F | Ht 64.0 in | Wt 194.2 lb

## 2019-10-01 DIAGNOSIS — G4733 Obstructive sleep apnea (adult) (pediatric): Secondary | ICD-10-CM | POA: Diagnosis not present

## 2019-10-01 NOTE — Progress Notes (Signed)
Subjective:   PATIENT ID: Tanya Munoz GENDER: female DOB: 1940-11-27, MRN: [id number]   HPI  Chief Complaint  Patient presents with  . Follow-up    Fatigue remains the same as at last visit. Used rescue inhaler twice yesterday.    Reason for Visit: Follow-up for dyspnea on exertion  Ms. Tanya Munoz is an 78 year old female remote former smoker with HTN, DM, HLD who presents for follow-up.  Since our last visit on 06/18/19, she reports fatigue and shortness of breath with exertion. She continues to use albuterol 1-2 times a day with exertion. Denies wheezing. She completed a sleep apnea test that did demonstrate mild/moderate OSA with hypoxemia. She does have prior history of sleep apnea that she wore nocturnal O2 for.  Social History: 1/4 ppd x 1 year. Quit in 1987.  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   Outpatient Medications Prior to Visit  Medication Sig Dispense Refill  . albuterol (PROVENTIL HFA;VENTOLIN HFA) 108 (90 Base) MCG/ACT inhaler Inhale 2 puffs into the lungs every 6 (six) hours as needed for wheezing or shortness of breath. 1 Inhaler 6  . aspirin 81 MG tablet Take 81 mg by mouth daily.      . benzonatate (TESSALON) 100 MG capsule Take 1 capsule (100 mg total) by mouth 3 (three) times daily as needed for cough. (Patient not taking: Reported on 06/18/2019) 60 capsule 1  . carvedilol (COREG) 3.125 MG tablet Take 3.125 mg by mouth 2 (two) times daily with a meal.      . losartan-hydrochlorothiazide (HYZAAR) 100-25 MG per tablet Take 1 tablet by mouth daily.      Marland Kitchen lovastatin (MEVACOR) 40 MG tablet Take 40 mg by mouth at bedtime.      . metFORMIN (GLUCOPHAGE) 850 MG tablet Take 850 mg by mouth 2 (two) times daily with a meal.      . montelukast (SINGULAIR) 10 MG tablet Take 10 mg by mouth at bedtime.    .  traMADol (ULTRAM) 50 MG tablet Take 50 mg by mouth every 6 (six) hours as needed. Maximum dose= 8 tablets per day For pain      No facility-administered medications prior to visit.    Review of Systems  Constitutional: Positive for malaise/fatigue. Negative for chills, diaphoresis, fever and weight loss.  HENT: Negative for congestion, ear pain and sore throat.   Respiratory: Positive for shortness of breath. Negative for cough, hemoptysis, sputum production and wheezing.   Cardiovascular: Negative for chest pain, palpitations and leg swelling.  Gastrointestinal: Negative for abdominal pain, heartburn and nausea.  Genitourinary: Negative for frequency.  Musculoskeletal: Negative for joint pain and myalgias.  Skin: Negative for itching and rash.  Neurological: Negative for dizziness, weakness and headaches.  Endo/Heme/Allergies: Does not bruise/bleed easily.  Psychiatric/Behavioral: Negative for depression. The patient is not nervous/anxious.      Objective:   Vitals:   10/01/19 1125  BP: 128/70  Pulse: 68  Temp: (!) 97.4 F (36.3 C)  SpO2: 97%  Weight: 194 lb 3.2 oz (88.1 kg)  Height: 5\' 4"  (1.626 m)      Physical Exam: General: Well-appearing, no acute distress HENT: [place], AT Eyes: EOMI, no scleral icterus Respiratory: Clear to auscultation bilaterally.  No crackles, wheezing or rales Cardiovascular: RRR, -M/R/G, no JVD GI: BS+, soft, nontender Extremities:-Edema,-tenderness Neuro: AAO x4, CNII-XII grossly intact Skin: Intact, no rashes or bruising  Psych: Normal mood, normal affect  Data Reviewed:  Imaging: CXR 08/17/11-No acute cardiopulmonary abnormalities  PFT: Spirometry 12/15/18 FVC 1.7 (85%) FEV1 1.5 (94%) Ratio 85   Interpretation: No evidence of obstructive defect  Sleep study 07/15/19 Moderate obstructive sleep apnea with AHI 15.2 and SpO2 nadir of 77%    Assessment & Plan:   Discussion: 78 year old female with moderate OSA, HTN, DM2 and HLD who  presents for follow-up. Initially presented for follow-up thought to be related to undiagnosed OSA. Home sleep study OSA reviewed and discussed with patient. Will refer for PAP titration. If she continues have dyspnea despite treatment, will consider weight loss and PFTs.  Mild/Moderate Obstructive sleep apnea with nocturnal hypoxemia Sleep study results: AHI 15.2 with O2 nadir 77%. Recommend in-lab sleep titration. I will order sleep supplies depending on the results of this test  Health Maintenance Pneumonia - Per PCP in [place]  Immunization History  Administered Date(s) Administered  . Fluad Quad(high Dose 65+) 06/18/2019  . Influenza, High Dose Seasonal PF 07/27/2018    Orders Placed This Encounter  Procedures  . Cpap titration    To be done in January per Dr. Loanne Drilling, with office visit in 4 months.    Standing Status:   Future    Standing Expiration Date:   11/01/2019    Order Specific Question:   Where should this test be performed:    Answer:   [facility]   No orders of the defined types were placed in this encounter.  Return in about 4 months (around 01/29/2020).  [place], MD [facility] 10/01/2019 10:50 AM  Office Number [phone]

## 2019-10-01 NOTE — Patient Instructions (Signed)
Mild/Moderate Obstructive sleep apnea with nocturnal hypoxemia Sleep study results: AHI 15.2 with O2 nadir 77%. Recommend in-lab sleep titration. I will order sleep supplies depending on the results of this test  Follow-up with me in 4 months.

## 2019-10-10 DIAGNOSIS — Z9989 Dependence on other enabling machines and devices: Secondary | ICD-10-CM | POA: Insufficient documentation

## 2019-10-10 DIAGNOSIS — G4733 Obstructive sleep apnea (adult) (pediatric): Secondary | ICD-10-CM | POA: Insufficient documentation

## 2019-10-15 NOTE — Telephone Encounter (Signed)
Order placed. Patient's husband in hospital not able to make appt right now She will call office back to schedule in March 2021 with JE

## 2019-10-16 NOTE — Telephone Encounter (Signed)
Pt aware she is due in 12/2019 she is also scheduled for the titration study on tues 1/19 ok to close this note

## 2019-10-17 ENCOUNTER — Other Ambulatory Visit (HOSPITAL_COMMUNITY)
Admission: RE | Admit: 2019-10-17 | Discharge: 2019-10-17 | Disposition: A | Discharge status: Home / Self Care | Payer: Medicare HMO | Source: Ambulatory Visit | Attending: Internal Medicine | Admitting: Internal Medicine

## 2019-10-17 DIAGNOSIS — Z20822 Contact with and (suspected) exposure to covid-19: Secondary | ICD-10-CM | POA: Diagnosis not present

## 2019-10-17 DIAGNOSIS — Z01812 Encounter for preprocedural laboratory examination: Secondary | ICD-10-CM | POA: Insufficient documentation

## 2019-10-17 LAB — SARS CORONAVIRUS 2 (TAT 6-24 HRS): SARS Coronavirus 2: NEGATIVE

## 2019-10-20 ENCOUNTER — Other Ambulatory Visit: Payer: Self-pay

## 2019-10-20 ENCOUNTER — Ambulatory Visit (HOSPITAL_BASED_OUTPATIENT_CLINIC_OR_DEPARTMENT_OTHER): Payer: Medicare HMO | Attending: Pulmonary Disease | Admitting: Internal Medicine

## 2019-10-20 DIAGNOSIS — G4733 Obstructive sleep apnea (adult) (pediatric): Secondary | ICD-10-CM | POA: Insufficient documentation

## 2019-10-21 ENCOUNTER — Other Ambulatory Visit (HOSPITAL_BASED_OUTPATIENT_CLINIC_OR_DEPARTMENT_OTHER): Payer: Self-pay

## 2019-10-21 DIAGNOSIS — G4733 Obstructive sleep apnea (adult) (pediatric): Secondary | ICD-10-CM

## 2019-10-22 DIAGNOSIS — Z961 Presence of intraocular lens: Secondary | ICD-10-CM | POA: Diagnosis not present

## 2019-10-22 DIAGNOSIS — E119 Type 2 diabetes mellitus without complications: Secondary | ICD-10-CM | POA: Diagnosis not present

## 2019-10-22 DIAGNOSIS — H04129 Dry eye syndrome of unspecified lacrimal gland: Secondary | ICD-10-CM | POA: Diagnosis not present

## 2019-10-24 DIAGNOSIS — G4733 Obstructive sleep apnea (adult) (pediatric): Secondary | ICD-10-CM

## 2019-10-24 NOTE — Procedures (Signed)
Patient Name: Tanya Munoz, [place] Date: 10/20/2019 Gender: Female D.O.B: 13-Apr-1941 Age (years): 68 Referring Provider: Chi Rodman Pickle Height (inches): 65 Interpreting Physician: Baird Lyons MD, ABSM Weight (lbs): 198 RPSGT: Laren Everts BMI: 33 MRN: [id number] Neck Size: 15.50  CLINICAL INFORMATION The patient is referred for a CPAP titration to treat sleep apnea.  Date of NPSG, Split Night or HST:   HST 07/15/2019  AHI 15.2/ hr, desaturation to 77%, body weight 199 lbs  SLEEP STUDY TECHNIQUE As per the AASM Manual for the Scoring of Sleep and Associated Events v2.3 (April 2016) with a hypopnea requiring 4% desaturations.  The channels recorded and monitored were frontal, central and occipital EEG, electrooculogram (EOG), submentalis EMG (chin), nasal and oral airflow, thoracic and abdominal wall motion, anterior tibialis EMG, snore microphone, electrocardiogram, and pulse oximetry. Continuous positive airway pressure (CPAP) was initiated at the beginning of the study and titrated to treat sleep-disordered breathing.  MEDICATIONS Medications self-administered by patient taken the night of the study : none reported  TECHNICIAN COMMENTS Comments added by technician: NONE Comments added by scorer: N/A RESPIRATORY PARAMETERS Optimal PAP Pressure (cm): 19 AHI at Optimal Pressure (/hr): 0.0 Overall Minimal O2 (%): 81.0 Supine % at Optimal Pressure (%): 73 Minimal O2 at Optimal Pressure (%): 93.0   SLEEP ARCHITECTURE The study was initiated at 10:00:21 PM and ended at 4:32:37 AM.  Sleep onset time was 27.9 minutes and the sleep efficiency was 64.4%%. The total sleep time was 252.5 minutes.  The patient spent 17.0%% of the night in stage N1 sleep, 83.0%% in stage N2 sleep, 0.0%% in stage N3 and 0% in REM.Stage REM latency was N/A minutes  Wake after sleep onset was 111.8. Alpha intrusion was absent. Supine sleep was 85.54%.  CARDIAC DATA The 2 lead EKG demonstrated  sinus rhythm. The mean heart rate was 74.1 beats per minute. Other EKG findings include: PVCs.  LEG MOVEMENT DATA The total Periodic Limb Movements of Sleep (PLMS) were 0. The PLMS index was 0.0. A PLMS index of <15 is considered normal in adults.  IMPRESSIONS - The optimal PAP pressure was 19 cm of water. - Central sleep apnea was not noted during this titration (CAI = 0.0/h). - Moderate oxygen desaturations were observed during this titration (min O2 = 81.0%). Minimum saturation on CPAP19 was 93%. - The patient snored with moderate snoring volume during this titration study. - 2-lead EKG demonstrated: PVCs - Clinically significant periodic limb movements were not noted during this study. Arousals associated with PLMs were rare.  DIAGNOSIS - Obstructive Sleep Apnea (327.23 [G47.33 ICD-10])  RECOMMENDATIONS - Trial of CPAP therapy on 19 cm H2O or autopap 15-20. - Patient used a Small size Fisher&Paykel Full Face Mask F&P Vitera (new) mask and heated humidification. - Be careful with alcohol, sedatives and other CNS depressants that may worsen sleep apnea and disrupt normal sleep architecture. - Sleep hygiene should be reviewed to assess factors that may improve sleep quality. - Weight management and regular exercise should be initiated or continued.  [Electronically signed] 10/24/2019 12:03 PM  Baird Lyons MD, [place], American Board of Sleep Medicine   NPI: [id number]                         [place], [facility]  ELECTRONICALLY SIGNED ON:  10/24/2019, 11:59 AM [place] PH: [phone]   FX: [phone] [phone]  SLEEP MEDICINE

## 2019-10-25 DIAGNOSIS — R05 Cough: Secondary | ICD-10-CM | POA: Diagnosis not present

## 2019-10-25 DIAGNOSIS — Z20828 Contact with and (suspected) exposure to other viral communicable diseases: Secondary | ICD-10-CM | POA: Diagnosis not present

## 2019-10-25 DIAGNOSIS — J209 Acute bronchitis, unspecified: Secondary | ICD-10-CM | POA: Diagnosis not present

## 2019-10-30 DIAGNOSIS — I10 Essential (primary) hypertension: Secondary | ICD-10-CM | POA: Diagnosis not present

## 2019-10-30 DIAGNOSIS — K589 Irritable bowel syndrome without diarrhea: Secondary | ICD-10-CM | POA: Diagnosis not present

## 2019-10-30 DIAGNOSIS — M159 Polyosteoarthritis, unspecified: Secondary | ICD-10-CM | POA: Diagnosis not present

## 2019-10-30 DIAGNOSIS — J45909 Unspecified asthma, uncomplicated: Secondary | ICD-10-CM | POA: Diagnosis not present

## 2019-10-30 DIAGNOSIS — E7800 Pure hypercholesterolemia, unspecified: Secondary | ICD-10-CM | POA: Diagnosis not present

## 2019-10-30 DIAGNOSIS — K219 Gastro-esophageal reflux disease without esophagitis: Secondary | ICD-10-CM | POA: Diagnosis not present

## 2019-10-30 DIAGNOSIS — E118 Type 2 diabetes mellitus with unspecified complications: Secondary | ICD-10-CM | POA: Diagnosis not present

## 2019-10-30 DIAGNOSIS — R05 Cough: Secondary | ICD-10-CM | POA: Diagnosis not present

## 2019-10-30 DIAGNOSIS — E1140 Type 2 diabetes mellitus with diabetic neuropathy, unspecified: Secondary | ICD-10-CM | POA: Diagnosis not present

## 2019-10-30 DIAGNOSIS — N183 Chronic kidney disease, stage 3 unspecified: Secondary | ICD-10-CM | POA: Diagnosis not present

## 2019-11-03 ENCOUNTER — Telehealth: Payer: Self-pay

## 2019-11-03 DIAGNOSIS — G4733 Obstructive sleep apnea (adult) (pediatric): Secondary | ICD-10-CM

## 2019-11-03 NOTE — Telephone Encounter (Signed)
-----   Message from [place], MD sent at 10/28/2019  3:50 PM EST ----- Please set patient up with sleep supplies and order autoPAP 15-69mmHg. Will need to follow-up with me in 2-3 months with CPAP compliance report.

## 2019-11-10 DIAGNOSIS — R69 Illness, unspecified: Secondary | ICD-10-CM | POA: Diagnosis not present

## 2019-11-12 ENCOUNTER — Telehealth: Payer: Self-pay | Admitting: Pulmonary Disease

## 2019-11-12 NOTE — Telephone Encounter (Signed)
I called [facility] to check the status of CPAP order. I spoke with a rep and she stated she didn't see the order but wil forward the message to the team that handles CPAP because they can see more recent orders. I called pt and made her aware that I am waiting for a call back about status of CPAP order. Will await return call.

## 2019-11-13 DIAGNOSIS — K589 Irritable bowel syndrome without diarrhea: Secondary | ICD-10-CM | POA: Diagnosis not present

## 2019-11-13 DIAGNOSIS — R05 Cough: Secondary | ICD-10-CM | POA: Diagnosis not present

## 2019-11-13 DIAGNOSIS — E1140 Type 2 diabetes mellitus with diabetic neuropathy, unspecified: Secondary | ICD-10-CM | POA: Diagnosis not present

## 2019-11-13 DIAGNOSIS — K219 Gastro-esophageal reflux disease without esophagitis: Secondary | ICD-10-CM | POA: Diagnosis not present

## 2019-11-13 DIAGNOSIS — J208 Acute bronchitis due to other specified organisms: Secondary | ICD-10-CM | POA: Diagnosis not present

## 2019-11-13 DIAGNOSIS — M159 Polyosteoarthritis, unspecified: Secondary | ICD-10-CM | POA: Diagnosis not present

## 2019-11-13 DIAGNOSIS — N183 Chronic kidney disease, stage 3 unspecified: Secondary | ICD-10-CM | POA: Diagnosis not present

## 2019-11-13 DIAGNOSIS — J45909 Unspecified asthma, uncomplicated: Secondary | ICD-10-CM | POA: Diagnosis not present

## 2019-11-13 DIAGNOSIS — Z6836 Body mass index (BMI) 36.0-36.9, adult: Secondary | ICD-10-CM | POA: Diagnosis not present

## 2019-11-16 NOTE — Telephone Encounter (Signed)
LMTCB

## 2019-11-17 NOTE — Telephone Encounter (Signed)
Spoke with Bethanne Ginger with [facility]. States that they got in touch with the pt and she has been scheduled for her appointment to get her machine on 11/19/19.

## 2019-11-25 DIAGNOSIS — G4733 Obstructive sleep apnea (adult) (pediatric): Secondary | ICD-10-CM | POA: Diagnosis not present

## 2019-11-27 DIAGNOSIS — K219 Gastro-esophageal reflux disease without esophagitis: Secondary | ICD-10-CM | POA: Diagnosis not present

## 2019-11-27 DIAGNOSIS — K589 Irritable bowel syndrome without diarrhea: Secondary | ICD-10-CM | POA: Diagnosis not present

## 2019-11-27 DIAGNOSIS — R05 Cough: Secondary | ICD-10-CM | POA: Diagnosis not present

## 2019-11-27 DIAGNOSIS — E1140 Type 2 diabetes mellitus with diabetic neuropathy, unspecified: Secondary | ICD-10-CM | POA: Diagnosis not present

## 2019-11-27 DIAGNOSIS — M159 Polyosteoarthritis, unspecified: Secondary | ICD-10-CM | POA: Diagnosis not present

## 2019-11-27 DIAGNOSIS — K5900 Constipation, unspecified: Secondary | ICD-10-CM | POA: Diagnosis not present

## 2019-11-27 DIAGNOSIS — J45909 Unspecified asthma, uncomplicated: Secondary | ICD-10-CM | POA: Diagnosis not present

## 2019-11-27 DIAGNOSIS — N183 Chronic kidney disease, stage 3 unspecified: Secondary | ICD-10-CM | POA: Diagnosis not present

## 2019-11-27 DIAGNOSIS — E118 Type 2 diabetes mellitus with unspecified complications: Secondary | ICD-10-CM | POA: Diagnosis not present

## 2019-11-27 DIAGNOSIS — E7800 Pure hypercholesterolemia, unspecified: Secondary | ICD-10-CM | POA: Diagnosis not present

## 2019-12-14 DIAGNOSIS — K589 Irritable bowel syndrome without diarrhea: Secondary | ICD-10-CM | POA: Diagnosis not present

## 2019-12-14 DIAGNOSIS — N183 Chronic kidney disease, stage 3 unspecified: Secondary | ICD-10-CM | POA: Diagnosis not present

## 2019-12-14 DIAGNOSIS — R05 Cough: Secondary | ICD-10-CM | POA: Diagnosis not present

## 2019-12-14 DIAGNOSIS — I10 Essential (primary) hypertension: Secondary | ICD-10-CM | POA: Diagnosis not present

## 2019-12-14 DIAGNOSIS — E7800 Pure hypercholesterolemia, unspecified: Secondary | ICD-10-CM | POA: Diagnosis not present

## 2019-12-14 DIAGNOSIS — M159 Polyosteoarthritis, unspecified: Secondary | ICD-10-CM | POA: Diagnosis not present

## 2019-12-14 DIAGNOSIS — E1140 Type 2 diabetes mellitus with diabetic neuropathy, unspecified: Secondary | ICD-10-CM | POA: Diagnosis not present

## 2019-12-14 DIAGNOSIS — J45909 Unspecified asthma, uncomplicated: Secondary | ICD-10-CM | POA: Diagnosis not present

## 2019-12-14 DIAGNOSIS — E118 Type 2 diabetes mellitus with unspecified complications: Secondary | ICD-10-CM | POA: Diagnosis not present

## 2019-12-14 DIAGNOSIS — K219 Gastro-esophageal reflux disease without esophagitis: Secondary | ICD-10-CM | POA: Diagnosis not present

## 2019-12-23 DIAGNOSIS — G4733 Obstructive sleep apnea (adult) (pediatric): Secondary | ICD-10-CM | POA: Diagnosis not present

## 2019-12-25 DIAGNOSIS — G4733 Obstructive sleep apnea (adult) (pediatric): Secondary | ICD-10-CM | POA: Diagnosis not present

## 2019-12-29 DIAGNOSIS — J309 Allergic rhinitis, unspecified: Secondary | ICD-10-CM | POA: Diagnosis not present

## 2019-12-29 DIAGNOSIS — E118 Type 2 diabetes mellitus with unspecified complications: Secondary | ICD-10-CM | POA: Diagnosis not present

## 2019-12-29 DIAGNOSIS — K589 Irritable bowel syndrome without diarrhea: Secondary | ICD-10-CM | POA: Diagnosis not present

## 2019-12-29 DIAGNOSIS — I10 Essential (primary) hypertension: Secondary | ICD-10-CM | POA: Diagnosis not present

## 2019-12-29 DIAGNOSIS — N183 Chronic kidney disease, stage 3 unspecified: Secondary | ICD-10-CM | POA: Diagnosis not present

## 2019-12-29 DIAGNOSIS — M159 Polyosteoarthritis, unspecified: Secondary | ICD-10-CM | POA: Diagnosis not present

## 2019-12-29 DIAGNOSIS — E1140 Type 2 diabetes mellitus with diabetic neuropathy, unspecified: Secondary | ICD-10-CM | POA: Diagnosis not present

## 2019-12-29 DIAGNOSIS — R05 Cough: Secondary | ICD-10-CM | POA: Diagnosis not present

## 2019-12-29 DIAGNOSIS — J45909 Unspecified asthma, uncomplicated: Secondary | ICD-10-CM | POA: Diagnosis not present

## 2019-12-29 DIAGNOSIS — K219 Gastro-esophageal reflux disease without esophagitis: Secondary | ICD-10-CM | POA: Diagnosis not present

## 2020-01-13 DIAGNOSIS — K219 Gastro-esophageal reflux disease without esophagitis: Secondary | ICD-10-CM | POA: Diagnosis not present

## 2020-01-13 DIAGNOSIS — K589 Irritable bowel syndrome without diarrhea: Secondary | ICD-10-CM | POA: Diagnosis not present

## 2020-01-13 DIAGNOSIS — I10 Essential (primary) hypertension: Secondary | ICD-10-CM | POA: Diagnosis not present

## 2020-01-13 DIAGNOSIS — N183 Chronic kidney disease, stage 3 unspecified: Secondary | ICD-10-CM | POA: Diagnosis not present

## 2020-01-13 DIAGNOSIS — R05 Cough: Secondary | ICD-10-CM | POA: Diagnosis not present

## 2020-01-13 DIAGNOSIS — J309 Allergic rhinitis, unspecified: Secondary | ICD-10-CM | POA: Diagnosis not present

## 2020-01-13 DIAGNOSIS — J45909 Unspecified asthma, uncomplicated: Secondary | ICD-10-CM | POA: Diagnosis not present

## 2020-01-13 DIAGNOSIS — M159 Polyosteoarthritis, unspecified: Secondary | ICD-10-CM | POA: Diagnosis not present

## 2020-01-13 DIAGNOSIS — E1140 Type 2 diabetes mellitus with diabetic neuropathy, unspecified: Secondary | ICD-10-CM | POA: Diagnosis not present

## 2020-01-13 DIAGNOSIS — E118 Type 2 diabetes mellitus with unspecified complications: Secondary | ICD-10-CM | POA: Diagnosis not present

## 2020-01-14 DIAGNOSIS — Z1231 Encounter for screening mammogram for malignant neoplasm of breast: Secondary | ICD-10-CM | POA: Diagnosis not present

## 2020-01-19 DIAGNOSIS — Z6833 Body mass index (BMI) 33.0-33.9, adult: Secondary | ICD-10-CM | POA: Diagnosis not present

## 2020-01-19 DIAGNOSIS — Z791 Long term (current) use of non-steroidal anti-inflammatories (NSAID): Secondary | ICD-10-CM | POA: Diagnosis not present

## 2020-01-19 DIAGNOSIS — G8929 Other chronic pain: Secondary | ICD-10-CM | POA: Diagnosis not present

## 2020-01-19 DIAGNOSIS — K219 Gastro-esophageal reflux disease without esophagitis: Secondary | ICD-10-CM | POA: Diagnosis not present

## 2020-01-19 DIAGNOSIS — E785 Hyperlipidemia, unspecified: Secondary | ICD-10-CM | POA: Diagnosis not present

## 2020-01-19 DIAGNOSIS — E119 Type 2 diabetes mellitus without complications: Secondary | ICD-10-CM | POA: Diagnosis not present

## 2020-01-19 DIAGNOSIS — I10 Essential (primary) hypertension: Secondary | ICD-10-CM | POA: Diagnosis not present

## 2020-01-19 DIAGNOSIS — E669 Obesity, unspecified: Secondary | ICD-10-CM | POA: Diagnosis not present

## 2020-01-19 DIAGNOSIS — Z7982 Long term (current) use of aspirin: Secondary | ICD-10-CM | POA: Diagnosis not present

## 2020-01-19 DIAGNOSIS — M1990 Unspecified osteoarthritis, unspecified site: Secondary | ICD-10-CM | POA: Diagnosis not present

## 2020-01-23 DIAGNOSIS — G4733 Obstructive sleep apnea (adult) (pediatric): Secondary | ICD-10-CM | POA: Diagnosis not present

## 2020-01-24 DIAGNOSIS — G4733 Obstructive sleep apnea (adult) (pediatric): Secondary | ICD-10-CM | POA: Diagnosis not present

## 2020-01-25 DIAGNOSIS — M159 Polyosteoarthritis, unspecified: Secondary | ICD-10-CM | POA: Diagnosis not present

## 2020-01-25 DIAGNOSIS — N183 Chronic kidney disease, stage 3 unspecified: Secondary | ICD-10-CM | POA: Diagnosis not present

## 2020-01-25 DIAGNOSIS — E118 Type 2 diabetes mellitus with unspecified complications: Secondary | ICD-10-CM | POA: Diagnosis not present

## 2020-01-25 DIAGNOSIS — E1140 Type 2 diabetes mellitus with diabetic neuropathy, unspecified: Secondary | ICD-10-CM | POA: Diagnosis not present

## 2020-01-25 DIAGNOSIS — J45909 Unspecified asthma, uncomplicated: Secondary | ICD-10-CM | POA: Diagnosis not present

## 2020-01-25 DIAGNOSIS — K589 Irritable bowel syndrome without diarrhea: Secondary | ICD-10-CM | POA: Diagnosis not present

## 2020-01-25 DIAGNOSIS — K219 Gastro-esophageal reflux disease without esophagitis: Secondary | ICD-10-CM | POA: Diagnosis not present

## 2020-01-25 DIAGNOSIS — J309 Allergic rhinitis, unspecified: Secondary | ICD-10-CM | POA: Diagnosis not present

## 2020-01-25 DIAGNOSIS — K5900 Constipation, unspecified: Secondary | ICD-10-CM | POA: Diagnosis not present

## 2020-01-25 DIAGNOSIS — R05 Cough: Secondary | ICD-10-CM | POA: Diagnosis not present

## 2020-01-26 ENCOUNTER — Other Ambulatory Visit: Payer: Self-pay

## 2020-01-26 ENCOUNTER — Encounter: Payer: Self-pay | Admitting: Pulmonary Disease

## 2020-01-26 ENCOUNTER — Ambulatory Visit: Payer: Medicare HMO | Admitting: Pulmonary Disease

## 2020-01-26 VITALS — BP 140/64 | HR 79 | Temp 97.7°F | Ht 64.0 in | Wt 178.8 lb

## 2020-01-26 DIAGNOSIS — Z9989 Dependence on other enabling machines and devices: Secondary | ICD-10-CM | POA: Diagnosis not present

## 2020-01-26 DIAGNOSIS — G4733 Obstructive sleep apnea (adult) (pediatric): Secondary | ICD-10-CM

## 2020-01-26 NOTE — Progress Notes (Signed)
Subjective:   PATIENT ID: Tanya Munoz GENDER: female DOB: 06/16/1941, MRN: [id number]   HPI  Chief Complaint  Patient presents with  . Follow-up    SOB with household activity, no cough, no whezzing     Reason for Visit: Follow-up for dyspnea on exertion  Tanya Munoz is an 79 year old female remote former smoker with HTN, DM, HLD who presents for follow-up.  She uses her albuterol 1-2 times a week. After using her CPAP she reports improved energy and dyspnea during the day. She still has dyspnea with heavy exertion. Denies wheezing or cough. [facility] which she previously enjoyed.  Social History: 1/4 ppd x 1 year. Quit in 1987.  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   Outpatient Medications Prior to Visit  Medication Sig Dispense Refill  . albuterol (PROVENTIL HFA;VENTOLIN HFA) 108 (90 Base) MCG/ACT inhaler Inhale 2 puffs into the lungs every 6 (six) hours as needed for wheezing or shortness of breath. 1 Inhaler 6  . aspirin 81 MG tablet Take 81 mg by mouth daily.      . benzonatate (TESSALON) 100 MG capsule Take 1 capsule (100 mg total) by mouth 3 (three) times daily as needed for cough. 60 capsule 1  . carvedilol (COREG) 3.125 MG tablet Take 3.125 mg by mouth 2 (two) times daily with a meal.      . losartan-hydrochlorothiazide (HYZAAR) 100-25 MG per tablet Take 1 tablet by mouth daily.      Marland Kitchen lovastatin (MEVACOR) 40 MG tablet Take 40 mg by mouth at bedtime.      . metFORMIN (GLUCOPHAGE) 850 MG tablet Take 850 mg by mouth 2 (two) times daily with a meal.      . montelukast (SINGULAIR) 10 MG tablet Take 10 mg by mouth at bedtime.    . traMADol (ULTRAM) 50 MG tablet Take 50 mg by mouth every 6 (six) hours as needed. Maximum dose= 8 tablets per day For  pain      No facility-administered medications prior to visit.    Review of Systems  Constitutional: Negative for chills, diaphoresis, fever, malaise/fatigue and weight loss.  HENT: Negative for congestion.   Respiratory: Positive for shortness of breath. Negative for cough, hemoptysis, sputum production and wheezing.   Cardiovascular: Negative for chest pain, palpitations and leg swelling.     Objective:   Vitals:   01/26/20 1000  BP: 140/64  Pulse: 79  Temp: 97.7 F (36.5 C)  TempSrc: Temporal  SpO2: 96%  Weight: 178 lb 12.8 oz (81.1 kg)  Height: 5\' 4"  (1.626 m)   SpO2: 96 % O2 Device: None (Room air)  Physical Exam: General: Well-appearing, no acute distress HENT: [place], AT Eyes: EOMI, no scleral icterus Respiratory: Clear to auscultation bilaterally.  No crackles, wheezing or rales Cardiovascular: RRR, -M/R/G, no JVD Extremities:-Edema,-tenderness Neuro: AAO x4, CNII-XII grossly intact Psych: Normal mood, normal affect  Data Reviewed:  Imaging: CXR 08/17/11-No acute cardiopulmonary abnormalities  PFT: Spirometry 12/15/18 FVC 1.7 (85%) FEV1 1.5 (94%) Ratio 85   Interpretation: No evidence of obstructive defect  Sleep study 07/15/19 Moderate obstructive sleep apnea with AHI 15.2 and SpO2 nadir of 77%  CPAP Compliance Report 12/26/19-01/24/20 Usage days 100% >4h >90% Median Pressure 13.8 Max 18.3 AHI 5.7    Assessment & Plan:   Discussion: 79 year old  female with moderate OSA, HTN, DM2 and HLD who presents for OSA follow-up. Demonstrated compliance to CPAP therapy with improved energy and dyspnea. Continues to have dyspnea that may be related to deconditioning.   Moderate obstructive sleep apnea --Great job on using your CPAP! --Continue to use CPAP every night --Avoid driving when sleepy --Encourage regular exercise and weight loss with healthy diet as we discussed in clinic. Here are some recommendations for healthy eating as listed below  Health  Maintenance Immunization History  Administered Date(s) Administered  . Fluad Quad(high Dose 65+) 06/18/2019  . Influenza, High Dose Seasonal PF 07/27/2018  . Moderna SARS-COVID-2 Vaccination 11/06/2019, 12/02/2019   No orders of the defined types were placed in this encounter.  No orders of the defined types were placed in this encounter.  Return in about 6 months (around 07/27/2020).  [place], MD [facility] 01/26/2020 10:06 AM  Office Number [phone]

## 2020-01-26 NOTE — Patient Instructions (Signed)
Moderate obstructive sleep apnea --Great job on using your CPAP! --Continue to use CPAP every night --Avoid driving when sleepy --Encourage regular exercise and weight loss with healthy diet as we discussed in clinic. Here are some recommendations for healthy eating as listed below   Healthy Eating Following a healthy eating pattern may help you to achieve and maintain a healthy body weight, reduce the risk of chronic disease, and live a long and productive life. It is important to follow a healthy eating pattern at an appropriate calorie level for your body. Your nutritional needs should be met primarily through food by choosing a variety of nutrient-rich foods. What are tips for following this plan? Reading food labels  Read labels and choose the following: ? Reduced or low sodium. ? Juices with 100% fruit juice. ? Foods with low saturated fats and high polyunsaturated and monounsaturated fats. ? Foods with whole grains, such as whole wheat, cracked wheat, brown rice, and wild rice. ? Whole grains that are fortified with folic acid. This is recommended for women who are pregnant or who want to become pregnant.  Read labels and avoid the following: ? Foods with a lot of added sugars. These include foods that contain brown sugar, corn sweetener, corn syrup, dextrose, fructose, glucose, high-fructose corn syrup, honey, invert sugar, lactose, malt syrup, maltose, molasses, raw sugar, sucrose, trehalose, or turbinado sugar.  Do not eat more than the following amounts of added sugar per day:  6 teaspoons (25 g) for women.  9 teaspoons (38 g) for men. ? Foods that contain processed or refined starches and grains. ? Refined grain products, such as white flour, degermed cornmeal, white bread, and white rice. Shopping  Choose nutrient-rich snacks, such as vegetables, whole fruits, and nuts. Avoid high-calorie and high-sugar snacks, such as potato chips, fruit snacks, and candy.  Use  oil-based dressings and spreads on foods instead of solid fats such as butter, stick margarine, or cream cheese.  Limit pre-made sauces, mixes, and "instant" products such as flavored rice, instant noodles, and ready-made pasta.  Try more plant-protein sources, such as tofu, tempeh, black beans, edamame, lentils, nuts, and seeds.  Explore eating plans such as the Mediterranean diet or vegetarian diet. Cooking  Use oil to saut or stir-fry foods instead of solid fats such as butter, stick margarine, or lard.  Try baking, boiling, grilling, or broiling instead of frying.  Remove the fatty part of meats before cooking.  Steam vegetables in water or broth. Meal planning   At meals, imagine dividing your plate into fourths: ? One-half of your plate is fruits and vegetables. ? One-fourth of your plate is whole grains. ? One-fourth of your plate is protein, especially lean meats, poultry, eggs, tofu, beans, or nuts.  Include low-fat dairy as part of your daily diet. Lifestyle  Choose healthy options in all settings, including home, work, school, restaurants, or stores.  Prepare your food safely: ? Wash your hands after handling raw meats. ? Keep food preparation surfaces clean by regularly washing with hot, soapy water. ? Keep raw meats separate from ready-to-eat foods, such as fruits and vegetables. ? Cook seafood, meat, poultry, and eggs to the recommended internal temperature. ? Store foods at safe temperatures. In general:  Keep cold foods at 22F (4.4C) or below.  Keep hot foods at 122F (60C) or above.  Keep your freezer at [facility] (-17.8C) or below.  Foods are no longer safe to eat when they have been between the temperatures of 40-122F (4.4-60C)  for more than 2 hours. What foods should I eat? Fruits Aim to eat 2 cup-equivalents of fresh, canned (in natural juice), or frozen fruits each day. Examples of 1 cup-equivalent of fruit include 1 small apple, 8 large  strawberries, 1 cup canned fruit,  cup dried fruit, or 1 cup 100% juice. Vegetables Aim to eat 2-3 cup-equivalents of fresh and frozen vegetables each day, including different varieties and colors. Examples of 1 cup-equivalent of vegetables include 2 medium carrots, 2 cups raw, leafy greens, 1 cup chopped vegetable (raw or cooked), or 1 medium baked potato. Grains Aim to eat 6 ounce-equivalents of whole grains each day. Examples of 1 ounce-equivalent of grains include 1 slice of bread, 1 cup ready-to-eat cereal, 3 cups popcorn, or  cup cooked rice, pasta, or cereal. Meats and other proteins Aim to eat 5-6 ounce-equivalents of protein each day. Examples of 1 ounce-equivalent of protein include 1 egg, 1/2 cup nuts or seeds, or 1 tablespoon (16 g) peanut butter. A cut of meat or fish that is the size of a deck of cards is about 3-4 ounce-equivalents.  Of the protein you eat each week, try to have at least 8 ounces come from seafood. This includes salmon, trout, herring, and anchovies. Dairy Aim to eat 3 cup-equivalents of fat-free or low-fat dairy each day. Examples of 1 cup-equivalent of dairy include 1 cup (240 mL) milk, 8 ounces (250 g) yogurt, 1 ounces (44 g) natural cheese, or 1 cup (240 mL) fortified soy milk. Fats and oils  Aim for about 5 teaspoons (21 g) per day. Choose monounsaturated fats, such as canola and olive oils, avocados, peanut butter, and most nuts, or polyunsaturated fats, such as sunflower, corn, and soybean oils, walnuts, pine nuts, sesame seeds, sunflower seeds, and flaxseed. Beverages  Aim for six 8-oz glasses of water per day. Limit coffee to three to five 8-oz cups per day.  Limit caffeinated beverages that have added calories, such as soda and energy drinks.  Limit alcohol intake to no more than 1 drink a day for nonpregnant women and 2 drinks a day for men. One drink equals 12 oz of beer (355 mL), 5 oz of wine (148 mL), or 1 oz of hard liquor (44 mL). Seasoning  and other foods  Avoid adding excess amounts of salt to your foods. Try flavoring foods with herbs and spices instead of salt.  Avoid adding sugar to foods.  Try using oil-based dressings, sauces, and spreads instead of solid fats. This information is based on general [place] nutrition guidelines. For more information, visit Build[employer].es. Exact amounts may vary based on your nutrition needs. Summary  A healthy eating plan may help you to maintain a healthy weight, reduce the risk of chronic diseases, and stay active throughout your life.  Plan your meals. Make sure you eat the right portions of a variety of nutrient-rich foods.  Try baking, boiling, grilling, or broiling instead of frying.  Choose healthy options in all settings, including home, work, school, restaurants, or stores. This information is not intended to replace advice given to you by your health care provider. Make sure you discuss any questions you have with your health care provider. Document Revised: 12/30/2017 Document Reviewed: 12/30/2017 Elsevier Patient Education  [place].

## 2020-01-29 DIAGNOSIS — E1140 Type 2 diabetes mellitus with diabetic neuropathy, unspecified: Secondary | ICD-10-CM | POA: Diagnosis not present

## 2020-01-29 DIAGNOSIS — N183 Chronic kidney disease, stage 3 unspecified: Secondary | ICD-10-CM | POA: Diagnosis not present

## 2020-01-29 DIAGNOSIS — E7800 Pure hypercholesterolemia, unspecified: Secondary | ICD-10-CM | POA: Diagnosis not present

## 2020-01-29 DIAGNOSIS — I10 Essential (primary) hypertension: Secondary | ICD-10-CM | POA: Diagnosis not present

## 2020-01-29 DIAGNOSIS — J309 Allergic rhinitis, unspecified: Secondary | ICD-10-CM | POA: Diagnosis not present

## 2020-01-29 DIAGNOSIS — E118 Type 2 diabetes mellitus with unspecified complications: Secondary | ICD-10-CM | POA: Diagnosis not present

## 2020-01-29 DIAGNOSIS — M159 Polyosteoarthritis, unspecified: Secondary | ICD-10-CM | POA: Diagnosis not present

## 2020-01-29 DIAGNOSIS — R05 Cough: Secondary | ICD-10-CM | POA: Diagnosis not present

## 2020-01-29 DIAGNOSIS — J45909 Unspecified asthma, uncomplicated: Secondary | ICD-10-CM | POA: Diagnosis not present

## 2020-01-29 DIAGNOSIS — K219 Gastro-esophageal reflux disease without esophagitis: Secondary | ICD-10-CM | POA: Diagnosis not present

## 2020-01-29 DIAGNOSIS — K589 Irritable bowel syndrome without diarrhea: Secondary | ICD-10-CM | POA: Diagnosis not present

## 2020-02-02 ENCOUNTER — Telehealth: Payer: Self-pay | Admitting: Pulmonary Disease

## 2020-02-02 MED ORDER — ALBUTEROL SULFATE HFA 108 (90 BASE) MCG/ACT IN AERS: 2.0000 | INHALATION_SPRAY | Freq: Four times a day (QID) | RESPIRATORY_TRACT | 5 refills | Status: AC | PRN

## 2020-02-02 NOTE — Telephone Encounter (Signed)
Called and spoke with Patient.  Patient stated she was told her albuterol refill was denied by provider, and needed OV. Patient was seen 01/26/20, by Dr Loanne Drilling. Albuterol refill sent to [facility].  Nothing further at this time.

## 2020-02-16 DIAGNOSIS — R69 Illness, unspecified: Secondary | ICD-10-CM | POA: Diagnosis not present

## 2020-02-22 DIAGNOSIS — G4733 Obstructive sleep apnea (adult) (pediatric): Secondary | ICD-10-CM | POA: Diagnosis not present

## 2020-03-24 DIAGNOSIS — J45909 Unspecified asthma, uncomplicated: Secondary | ICD-10-CM | POA: Diagnosis not present

## 2020-03-24 DIAGNOSIS — R05 Cough: Secondary | ICD-10-CM | POA: Diagnosis not present

## 2020-03-24 DIAGNOSIS — J309 Allergic rhinitis, unspecified: Secondary | ICD-10-CM | POA: Diagnosis not present

## 2020-03-24 DIAGNOSIS — K5900 Constipation, unspecified: Secondary | ICD-10-CM | POA: Diagnosis not present

## 2020-03-24 DIAGNOSIS — N183 Chronic kidney disease, stage 3 unspecified: Secondary | ICD-10-CM | POA: Diagnosis not present

## 2020-03-24 DIAGNOSIS — M159 Polyosteoarthritis, unspecified: Secondary | ICD-10-CM | POA: Diagnosis not present

## 2020-03-24 DIAGNOSIS — E1140 Type 2 diabetes mellitus with diabetic neuropathy, unspecified: Secondary | ICD-10-CM | POA: Diagnosis not present

## 2020-03-24 DIAGNOSIS — G4733 Obstructive sleep apnea (adult) (pediatric): Secondary | ICD-10-CM | POA: Diagnosis not present

## 2020-03-24 DIAGNOSIS — K589 Irritable bowel syndrome without diarrhea: Secondary | ICD-10-CM | POA: Diagnosis not present

## 2020-03-24 DIAGNOSIS — K219 Gastro-esophageal reflux disease without esophagitis: Secondary | ICD-10-CM | POA: Diagnosis not present

## 2020-03-24 DIAGNOSIS — M25552 Pain in left hip: Secondary | ICD-10-CM | POA: Diagnosis not present

## 2020-04-22 DIAGNOSIS — Z0000 Encounter for general adult medical examination without abnormal findings: Secondary | ICD-10-CM | POA: Diagnosis not present

## 2020-04-22 DIAGNOSIS — Z9181 History of falling: Secondary | ICD-10-CM | POA: Diagnosis not present

## 2020-04-22 DIAGNOSIS — E785 Hyperlipidemia, unspecified: Secondary | ICD-10-CM | POA: Diagnosis not present

## 2020-04-22 DIAGNOSIS — Z1331 Encounter for screening for depression: Secondary | ICD-10-CM | POA: Diagnosis not present

## 2020-04-22 DIAGNOSIS — Z139 Encounter for screening, unspecified: Secondary | ICD-10-CM | POA: Diagnosis not present

## 2020-04-22 DIAGNOSIS — Z6836 Body mass index (BMI) 36.0-36.9, adult: Secondary | ICD-10-CM | POA: Diagnosis not present

## 2020-04-23 DIAGNOSIS — G4733 Obstructive sleep apnea (adult) (pediatric): Secondary | ICD-10-CM | POA: Diagnosis not present

## 2020-04-28 DIAGNOSIS — E118 Type 2 diabetes mellitus with unspecified complications: Secondary | ICD-10-CM | POA: Diagnosis not present

## 2020-04-28 DIAGNOSIS — J309 Allergic rhinitis, unspecified: Secondary | ICD-10-CM | POA: Diagnosis not present

## 2020-04-28 DIAGNOSIS — Z780 Asymptomatic menopausal state: Secondary | ICD-10-CM | POA: Diagnosis not present

## 2020-04-28 DIAGNOSIS — R05 Cough: Secondary | ICD-10-CM | POA: Diagnosis not present

## 2020-04-28 DIAGNOSIS — E1140 Type 2 diabetes mellitus with diabetic neuropathy, unspecified: Secondary | ICD-10-CM | POA: Diagnosis not present

## 2020-04-28 DIAGNOSIS — I10 Essential (primary) hypertension: Secondary | ICD-10-CM | POA: Diagnosis not present

## 2020-04-28 DIAGNOSIS — N183 Chronic kidney disease, stage 3 unspecified: Secondary | ICD-10-CM | POA: Diagnosis not present

## 2020-04-28 DIAGNOSIS — E7800 Pure hypercholesterolemia, unspecified: Secondary | ICD-10-CM | POA: Diagnosis not present

## 2020-04-28 DIAGNOSIS — K589 Irritable bowel syndrome without diarrhea: Secondary | ICD-10-CM | POA: Diagnosis not present

## 2020-04-28 DIAGNOSIS — M159 Polyosteoarthritis, unspecified: Secondary | ICD-10-CM | POA: Diagnosis not present

## 2020-04-28 DIAGNOSIS — J45909 Unspecified asthma, uncomplicated: Secondary | ICD-10-CM | POA: Diagnosis not present

## 2020-04-28 DIAGNOSIS — K5900 Constipation, unspecified: Secondary | ICD-10-CM | POA: Diagnosis not present

## 2020-04-28 DIAGNOSIS — K219 Gastro-esophageal reflux disease without esophagitis: Secondary | ICD-10-CM | POA: Diagnosis not present

## 2020-05-03 ENCOUNTER — Encounter: Payer: Self-pay | Admitting: Pulmonary Disease

## 2020-05-13 DIAGNOSIS — R69 Illness, unspecified: Secondary | ICD-10-CM | POA: Diagnosis not present

## 2020-05-18 DIAGNOSIS — J45909 Unspecified asthma, uncomplicated: Secondary | ICD-10-CM | POA: Diagnosis not present

## 2020-05-18 DIAGNOSIS — K5900 Constipation, unspecified: Secondary | ICD-10-CM | POA: Diagnosis not present

## 2020-05-18 DIAGNOSIS — K219 Gastro-esophageal reflux disease without esophagitis: Secondary | ICD-10-CM | POA: Diagnosis not present

## 2020-05-18 DIAGNOSIS — Z780 Asymptomatic menopausal state: Secondary | ICD-10-CM | POA: Diagnosis not present

## 2020-05-18 DIAGNOSIS — R05 Cough: Secondary | ICD-10-CM | POA: Diagnosis not present

## 2020-05-18 DIAGNOSIS — J309 Allergic rhinitis, unspecified: Secondary | ICD-10-CM | POA: Diagnosis not present

## 2020-05-18 DIAGNOSIS — K589 Irritable bowel syndrome without diarrhea: Secondary | ICD-10-CM | POA: Diagnosis not present

## 2020-05-18 DIAGNOSIS — N183 Chronic kidney disease, stage 3 unspecified: Secondary | ICD-10-CM | POA: Diagnosis not present

## 2020-05-18 DIAGNOSIS — M159 Polyosteoarthritis, unspecified: Secondary | ICD-10-CM | POA: Diagnosis not present

## 2020-05-18 DIAGNOSIS — E1140 Type 2 diabetes mellitus with diabetic neuropathy, unspecified: Secondary | ICD-10-CM | POA: Diagnosis not present

## 2020-05-24 DIAGNOSIS — G4733 Obstructive sleep apnea (adult) (pediatric): Secondary | ICD-10-CM | POA: Diagnosis not present

## 2020-06-05 DIAGNOSIS — I700 Atherosclerosis of aorta: Secondary | ICD-10-CM | POA: Diagnosis not present

## 2020-06-05 DIAGNOSIS — N361 Urethral diverticulum: Secondary | ICD-10-CM | POA: Diagnosis not present

## 2020-06-05 DIAGNOSIS — N200 Calculus of kidney: Secondary | ICD-10-CM | POA: Diagnosis not present

## 2020-06-05 DIAGNOSIS — R1084 Generalized abdominal pain: Secondary | ICD-10-CM | POA: Diagnosis not present

## 2020-06-05 DIAGNOSIS — N211 Calculus in urethra: Secondary | ICD-10-CM | POA: Diagnosis not present

## 2020-06-05 DIAGNOSIS — N3090 Cystitis, unspecified without hematuria: Secondary | ICD-10-CM | POA: Diagnosis not present

## 2020-06-10 DIAGNOSIS — K219 Gastro-esophageal reflux disease without esophagitis: Secondary | ICD-10-CM | POA: Diagnosis not present

## 2020-06-10 DIAGNOSIS — R05 Cough: Secondary | ICD-10-CM | POA: Diagnosis not present

## 2020-06-10 DIAGNOSIS — I10 Essential (primary) hypertension: Secondary | ICD-10-CM | POA: Diagnosis not present

## 2020-06-10 DIAGNOSIS — J45909 Unspecified asthma, uncomplicated: Secondary | ICD-10-CM | POA: Diagnosis not present

## 2020-06-10 DIAGNOSIS — K589 Irritable bowel syndrome without diarrhea: Secondary | ICD-10-CM | POA: Diagnosis not present

## 2020-06-10 DIAGNOSIS — K5900 Constipation, unspecified: Secondary | ICD-10-CM | POA: Diagnosis not present

## 2020-06-10 DIAGNOSIS — E1140 Type 2 diabetes mellitus with diabetic neuropathy, unspecified: Secondary | ICD-10-CM | POA: Diagnosis not present

## 2020-06-10 DIAGNOSIS — M159 Polyosteoarthritis, unspecified: Secondary | ICD-10-CM | POA: Diagnosis not present

## 2020-06-10 DIAGNOSIS — J309 Allergic rhinitis, unspecified: Secondary | ICD-10-CM | POA: Diagnosis not present

## 2020-06-10 DIAGNOSIS — N183 Chronic kidney disease, stage 3 unspecified: Secondary | ICD-10-CM | POA: Diagnosis not present

## 2020-06-13 DIAGNOSIS — K589 Irritable bowel syndrome without diarrhea: Secondary | ICD-10-CM | POA: Diagnosis not present

## 2020-06-13 DIAGNOSIS — E1140 Type 2 diabetes mellitus with diabetic neuropathy, unspecified: Secondary | ICD-10-CM | POA: Diagnosis not present

## 2020-06-13 DIAGNOSIS — I10 Essential (primary) hypertension: Secondary | ICD-10-CM | POA: Diagnosis not present

## 2020-06-13 DIAGNOSIS — J45909 Unspecified asthma, uncomplicated: Secondary | ICD-10-CM | POA: Diagnosis not present

## 2020-06-13 DIAGNOSIS — K5900 Constipation, unspecified: Secondary | ICD-10-CM | POA: Diagnosis not present

## 2020-06-13 DIAGNOSIS — K219 Gastro-esophageal reflux disease without esophagitis: Secondary | ICD-10-CM | POA: Diagnosis not present

## 2020-06-13 DIAGNOSIS — N183 Chronic kidney disease, stage 3 unspecified: Secondary | ICD-10-CM | POA: Diagnosis not present

## 2020-06-13 DIAGNOSIS — M159 Polyosteoarthritis, unspecified: Secondary | ICD-10-CM | POA: Diagnosis not present

## 2020-06-13 DIAGNOSIS — R05 Cough: Secondary | ICD-10-CM | POA: Diagnosis not present

## 2020-06-13 DIAGNOSIS — J309 Allergic rhinitis, unspecified: Secondary | ICD-10-CM | POA: Diagnosis not present

## 2020-06-16 DIAGNOSIS — I10 Essential (primary) hypertension: Secondary | ICD-10-CM | POA: Diagnosis not present

## 2020-06-16 DIAGNOSIS — K589 Irritable bowel syndrome without diarrhea: Secondary | ICD-10-CM | POA: Diagnosis not present

## 2020-06-16 DIAGNOSIS — K219 Gastro-esophageal reflux disease without esophagitis: Secondary | ICD-10-CM | POA: Diagnosis not present

## 2020-06-16 DIAGNOSIS — N200 Calculus of kidney: Secondary | ICD-10-CM | POA: Diagnosis not present

## 2020-06-16 DIAGNOSIS — N183 Chronic kidney disease, stage 3 unspecified: Secondary | ICD-10-CM | POA: Diagnosis not present

## 2020-06-16 DIAGNOSIS — J309 Allergic rhinitis, unspecified: Secondary | ICD-10-CM | POA: Diagnosis not present

## 2020-06-16 DIAGNOSIS — E118 Type 2 diabetes mellitus with unspecified complications: Secondary | ICD-10-CM | POA: Diagnosis not present

## 2020-06-16 DIAGNOSIS — J45909 Unspecified asthma, uncomplicated: Secondary | ICD-10-CM | POA: Diagnosis not present

## 2020-06-16 DIAGNOSIS — E1122 Type 2 diabetes mellitus with diabetic chronic kidney disease: Secondary | ICD-10-CM | POA: Diagnosis not present

## 2020-06-16 DIAGNOSIS — N184 Chronic kidney disease, stage 4 (severe): Secondary | ICD-10-CM | POA: Diagnosis not present

## 2020-06-20 DIAGNOSIS — N184 Chronic kidney disease, stage 4 (severe): Secondary | ICD-10-CM | POA: Diagnosis not present

## 2020-06-20 DIAGNOSIS — K589 Irritable bowel syndrome without diarrhea: Secondary | ICD-10-CM | POA: Diagnosis not present

## 2020-06-20 DIAGNOSIS — N200 Calculus of kidney: Secondary | ICD-10-CM | POA: Diagnosis not present

## 2020-06-20 DIAGNOSIS — I10 Essential (primary) hypertension: Secondary | ICD-10-CM | POA: Diagnosis not present

## 2020-06-20 DIAGNOSIS — E118 Type 2 diabetes mellitus with unspecified complications: Secondary | ICD-10-CM | POA: Diagnosis not present

## 2020-06-20 DIAGNOSIS — E1122 Type 2 diabetes mellitus with diabetic chronic kidney disease: Secondary | ICD-10-CM | POA: Diagnosis not present

## 2020-06-20 DIAGNOSIS — J309 Allergic rhinitis, unspecified: Secondary | ICD-10-CM | POA: Diagnosis not present

## 2020-06-20 DIAGNOSIS — A048 Other specified bacterial intestinal infections: Secondary | ICD-10-CM | POA: Diagnosis not present

## 2020-06-20 DIAGNOSIS — E875 Hyperkalemia: Secondary | ICD-10-CM | POA: Diagnosis not present

## 2020-06-20 DIAGNOSIS — N183 Chronic kidney disease, stage 3 unspecified: Secondary | ICD-10-CM | POA: Diagnosis not present

## 2020-06-23 DIAGNOSIS — E875 Hyperkalemia: Secondary | ICD-10-CM | POA: Diagnosis not present

## 2020-06-23 DIAGNOSIS — K589 Irritable bowel syndrome without diarrhea: Secondary | ICD-10-CM | POA: Diagnosis not present

## 2020-06-23 DIAGNOSIS — K219 Gastro-esophageal reflux disease without esophagitis: Secondary | ICD-10-CM | POA: Diagnosis not present

## 2020-06-23 DIAGNOSIS — N183 Chronic kidney disease, stage 3 unspecified: Secondary | ICD-10-CM | POA: Diagnosis not present

## 2020-06-23 DIAGNOSIS — J309 Allergic rhinitis, unspecified: Secondary | ICD-10-CM | POA: Diagnosis not present

## 2020-06-23 DIAGNOSIS — A048 Other specified bacterial intestinal infections: Secondary | ICD-10-CM | POA: Diagnosis not present

## 2020-06-23 DIAGNOSIS — N200 Calculus of kidney: Secondary | ICD-10-CM | POA: Diagnosis not present

## 2020-06-23 DIAGNOSIS — E118 Type 2 diabetes mellitus with unspecified complications: Secondary | ICD-10-CM | POA: Diagnosis not present

## 2020-06-23 DIAGNOSIS — I10 Essential (primary) hypertension: Secondary | ICD-10-CM | POA: Diagnosis not present

## 2020-06-23 DIAGNOSIS — J45909 Unspecified asthma, uncomplicated: Secondary | ICD-10-CM | POA: Diagnosis not present

## 2020-06-24 DIAGNOSIS — G4733 Obstructive sleep apnea (adult) (pediatric): Secondary | ICD-10-CM | POA: Diagnosis not present

## 2020-06-29 DIAGNOSIS — J309 Allergic rhinitis, unspecified: Secondary | ICD-10-CM | POA: Diagnosis not present

## 2020-06-29 DIAGNOSIS — E118 Type 2 diabetes mellitus with unspecified complications: Secondary | ICD-10-CM | POA: Diagnosis not present

## 2020-06-29 DIAGNOSIS — A048 Other specified bacterial intestinal infections: Secondary | ICD-10-CM | POA: Diagnosis not present

## 2020-06-29 DIAGNOSIS — N200 Calculus of kidney: Secondary | ICD-10-CM | POA: Diagnosis not present

## 2020-06-29 DIAGNOSIS — N183 Chronic kidney disease, stage 3 unspecified: Secondary | ICD-10-CM | POA: Diagnosis not present

## 2020-06-29 DIAGNOSIS — B029 Zoster without complications: Secondary | ICD-10-CM | POA: Diagnosis not present

## 2020-06-29 DIAGNOSIS — K219 Gastro-esophageal reflux disease without esophagitis: Secondary | ICD-10-CM | POA: Diagnosis not present

## 2020-06-29 DIAGNOSIS — E875 Hyperkalemia: Secondary | ICD-10-CM | POA: Diagnosis not present

## 2020-06-29 DIAGNOSIS — I10 Essential (primary) hypertension: Secondary | ICD-10-CM | POA: Diagnosis not present

## 2020-06-29 DIAGNOSIS — K589 Irritable bowel syndrome without diarrhea: Secondary | ICD-10-CM | POA: Diagnosis not present

## 2021-09-07 DIAGNOSIS — R053 Chronic cough: Secondary | ICD-10-CM | POA: Diagnosis not present

## 2021-09-07 DIAGNOSIS — K219 Gastro-esophageal reflux disease without esophagitis: Secondary | ICD-10-CM | POA: Diagnosis not present

## 2021-09-07 DIAGNOSIS — J309 Allergic rhinitis, unspecified: Secondary | ICD-10-CM | POA: Diagnosis not present

## 2021-09-07 DIAGNOSIS — M659 Synovitis and tenosynovitis, unspecified: Secondary | ICD-10-CM | POA: Diagnosis not present

## 2021-09-07 DIAGNOSIS — N200 Calculus of kidney: Secondary | ICD-10-CM | POA: Diagnosis not present

## 2021-09-07 DIAGNOSIS — K5900 Constipation, unspecified: Secondary | ICD-10-CM | POA: Diagnosis not present

## 2021-09-07 DIAGNOSIS — E1140 Type 2 diabetes mellitus with diabetic neuropathy, unspecified: Secondary | ICD-10-CM | POA: Diagnosis not present

## 2021-09-07 DIAGNOSIS — K589 Irritable bowel syndrome without diarrhea: Secondary | ICD-10-CM | POA: Diagnosis not present

## 2021-09-07 DIAGNOSIS — E875 Hyperkalemia: Secondary | ICD-10-CM | POA: Diagnosis not present

## 2021-09-07 DIAGNOSIS — J45909 Unspecified asthma, uncomplicated: Secondary | ICD-10-CM | POA: Diagnosis not present

## 2021-09-07 DIAGNOSIS — E1122 Type 2 diabetes mellitus with diabetic chronic kidney disease: Secondary | ICD-10-CM | POA: Diagnosis not present

## 2021-09-07 DIAGNOSIS — Z780 Asymptomatic menopausal state: Secondary | ICD-10-CM | POA: Diagnosis not present

## 2021-10-14 DIAGNOSIS — I440 Atrioventricular block, first degree: Secondary | ICD-10-CM | POA: Diagnosis not present

## 2021-10-14 DIAGNOSIS — R030 Elevated blood-pressure reading, without diagnosis of hypertension: Secondary | ICD-10-CM | POA: Diagnosis not present

## 2021-10-14 DIAGNOSIS — R531 Weakness: Secondary | ICD-10-CM | POA: Diagnosis not present

## 2021-10-14 DIAGNOSIS — I639 Cerebral infarction, unspecified: Secondary | ICD-10-CM | POA: Diagnosis not present

## 2021-10-14 DIAGNOSIS — R9431 Abnormal electrocardiogram [ECG] [EKG]: Secondary | ICD-10-CM | POA: Diagnosis not present

## 2021-10-14 DIAGNOSIS — R079 Chest pain, unspecified: Secondary | ICD-10-CM | POA: Diagnosis not present

## 2021-10-14 DIAGNOSIS — I10 Essential (primary) hypertension: Secondary | ICD-10-CM | POA: Diagnosis not present

## 2021-10-14 DIAGNOSIS — R42 Dizziness and giddiness: Secondary | ICD-10-CM | POA: Diagnosis not present

## 2021-10-14 DIAGNOSIS — R61 Generalized hyperhidrosis: Secondary | ICD-10-CM | POA: Diagnosis not present

## 2021-10-14 DIAGNOSIS — D649 Anemia, unspecified: Secondary | ICD-10-CM | POA: Diagnosis not present

## 2021-10-16 DIAGNOSIS — I440 Atrioventricular block, first degree: Secondary | ICD-10-CM | POA: Diagnosis not present

## 2021-10-17 DIAGNOSIS — E7800 Pure hypercholesterolemia, unspecified: Secondary | ICD-10-CM | POA: Diagnosis not present

## 2021-10-17 DIAGNOSIS — J45909 Unspecified asthma, uncomplicated: Secondary | ICD-10-CM | POA: Diagnosis not present

## 2021-10-17 DIAGNOSIS — M159 Polyosteoarthritis, unspecified: Secondary | ICD-10-CM | POA: Diagnosis not present

## 2021-10-17 DIAGNOSIS — K589 Irritable bowel syndrome without diarrhea: Secondary | ICD-10-CM | POA: Diagnosis not present

## 2021-10-17 DIAGNOSIS — K219 Gastro-esophageal reflux disease without esophagitis: Secondary | ICD-10-CM | POA: Diagnosis not present

## 2021-10-17 DIAGNOSIS — I10 Essential (primary) hypertension: Secondary | ICD-10-CM | POA: Diagnosis not present

## 2021-10-17 DIAGNOSIS — E1169 Type 2 diabetes mellitus with other specified complication: Secondary | ICD-10-CM | POA: Diagnosis not present

## 2021-10-17 DIAGNOSIS — J309 Allergic rhinitis, unspecified: Secondary | ICD-10-CM | POA: Diagnosis not present

## 2021-10-17 DIAGNOSIS — N200 Calculus of kidney: Secondary | ICD-10-CM | POA: Diagnosis not present

## 2021-10-17 DIAGNOSIS — M659 Synovitis and tenosynovitis, unspecified: Secondary | ICD-10-CM | POA: Diagnosis not present

## 2021-10-17 DIAGNOSIS — E1140 Type 2 diabetes mellitus with diabetic neuropathy, unspecified: Secondary | ICD-10-CM | POA: Diagnosis not present

## 2021-10-25 DIAGNOSIS — M159 Polyosteoarthritis, unspecified: Secondary | ICD-10-CM | POA: Diagnosis not present

## 2021-10-25 DIAGNOSIS — M659 Synovitis and tenosynovitis, unspecified: Secondary | ICD-10-CM | POA: Diagnosis not present

## 2021-10-25 DIAGNOSIS — K219 Gastro-esophageal reflux disease without esophagitis: Secondary | ICD-10-CM | POA: Diagnosis not present

## 2021-10-25 DIAGNOSIS — K589 Irritable bowel syndrome without diarrhea: Secondary | ICD-10-CM | POA: Diagnosis not present

## 2021-10-25 DIAGNOSIS — E1140 Type 2 diabetes mellitus with diabetic neuropathy, unspecified: Secondary | ICD-10-CM | POA: Diagnosis not present

## 2021-10-25 DIAGNOSIS — E1169 Type 2 diabetes mellitus with other specified complication: Secondary | ICD-10-CM | POA: Diagnosis not present

## 2021-10-25 DIAGNOSIS — J309 Allergic rhinitis, unspecified: Secondary | ICD-10-CM | POA: Diagnosis not present

## 2021-10-25 DIAGNOSIS — J45909 Unspecified asthma, uncomplicated: Secondary | ICD-10-CM | POA: Diagnosis not present

## 2021-10-25 DIAGNOSIS — E7800 Pure hypercholesterolemia, unspecified: Secondary | ICD-10-CM | POA: Diagnosis not present

## 2021-10-25 DIAGNOSIS — I10 Essential (primary) hypertension: Secondary | ICD-10-CM | POA: Diagnosis not present

## 2021-10-25 DIAGNOSIS — N200 Calculus of kidney: Secondary | ICD-10-CM | POA: Diagnosis not present

## 2021-10-30 DIAGNOSIS — R079 Chest pain, unspecified: Secondary | ICD-10-CM | POA: Diagnosis not present

## 2021-11-01 DIAGNOSIS — I10 Essential (primary) hypertension: Secondary | ICD-10-CM | POA: Diagnosis not present

## 2021-11-01 DIAGNOSIS — K219 Gastro-esophageal reflux disease without esophagitis: Secondary | ICD-10-CM | POA: Diagnosis not present

## 2021-11-01 DIAGNOSIS — M159 Polyosteoarthritis, unspecified: Secondary | ICD-10-CM | POA: Diagnosis not present

## 2021-11-01 DIAGNOSIS — J309 Allergic rhinitis, unspecified: Secondary | ICD-10-CM | POA: Diagnosis not present

## 2021-11-01 DIAGNOSIS — K589 Irritable bowel syndrome without diarrhea: Secondary | ICD-10-CM | POA: Diagnosis not present

## 2021-11-01 DIAGNOSIS — E1140 Type 2 diabetes mellitus with diabetic neuropathy, unspecified: Secondary | ICD-10-CM | POA: Diagnosis not present

## 2021-11-01 DIAGNOSIS — J45909 Unspecified asthma, uncomplicated: Secondary | ICD-10-CM | POA: Diagnosis not present

## 2021-11-01 DIAGNOSIS — E7800 Pure hypercholesterolemia, unspecified: Secondary | ICD-10-CM | POA: Diagnosis not present

## 2021-11-01 DIAGNOSIS — E1169 Type 2 diabetes mellitus with other specified complication: Secondary | ICD-10-CM | POA: Diagnosis not present

## 2021-11-01 DIAGNOSIS — N200 Calculus of kidney: Secondary | ICD-10-CM | POA: Diagnosis not present

## 2021-11-01 DIAGNOSIS — R053 Chronic cough: Secondary | ICD-10-CM | POA: Diagnosis not present

## 2021-11-02 DIAGNOSIS — E119 Type 2 diabetes mellitus without complications: Secondary | ICD-10-CM | POA: Diagnosis not present

## 2021-11-22 DIAGNOSIS — N8111 Cystocele, midline: Secondary | ICD-10-CM | POA: Diagnosis not present

## 2021-11-22 DIAGNOSIS — N390 Urinary tract infection, site not specified: Secondary | ICD-10-CM | POA: Diagnosis not present

## 2021-11-22 DIAGNOSIS — N3289 Other specified disorders of bladder: Secondary | ICD-10-CM | POA: Diagnosis not present

## 2022-02-06 DIAGNOSIS — N200 Calculus of kidney: Secondary | ICD-10-CM | POA: Diagnosis not present

## 2022-02-06 DIAGNOSIS — E1169 Type 2 diabetes mellitus with other specified complication: Secondary | ICD-10-CM | POA: Diagnosis not present

## 2022-02-06 DIAGNOSIS — J45909 Unspecified asthma, uncomplicated: Secondary | ICD-10-CM | POA: Diagnosis not present

## 2022-02-06 DIAGNOSIS — R053 Chronic cough: Secondary | ICD-10-CM | POA: Diagnosis not present

## 2022-02-06 DIAGNOSIS — K589 Irritable bowel syndrome without diarrhea: Secondary | ICD-10-CM | POA: Diagnosis not present

## 2022-02-06 DIAGNOSIS — E7800 Pure hypercholesterolemia, unspecified: Secondary | ICD-10-CM | POA: Diagnosis not present

## 2022-02-06 DIAGNOSIS — J309 Allergic rhinitis, unspecified: Secondary | ICD-10-CM | POA: Diagnosis not present

## 2022-02-06 DIAGNOSIS — K219 Gastro-esophageal reflux disease without esophagitis: Secondary | ICD-10-CM | POA: Diagnosis not present

## 2022-02-06 DIAGNOSIS — E1140 Type 2 diabetes mellitus with diabetic neuropathy, unspecified: Secondary | ICD-10-CM | POA: Diagnosis not present

## 2022-02-06 DIAGNOSIS — M159 Polyosteoarthritis, unspecified: Secondary | ICD-10-CM | POA: Diagnosis not present

## 2022-02-06 DIAGNOSIS — I10 Essential (primary) hypertension: Secondary | ICD-10-CM | POA: Diagnosis not present

## 2022-02-07 DIAGNOSIS — E1169 Type 2 diabetes mellitus with other specified complication: Secondary | ICD-10-CM | POA: Diagnosis not present

## 2022-02-13 DIAGNOSIS — Z1231 Encounter for screening mammogram for malignant neoplasm of breast: Secondary | ICD-10-CM | POA: Diagnosis not present

## 2022-03-11 DIAGNOSIS — S01111A Laceration without foreign body of right eyelid and periocular area, initial encounter: Secondary | ICD-10-CM | POA: Diagnosis not present

## 2022-03-11 DIAGNOSIS — M791 Myalgia, unspecified site: Secondary | ICD-10-CM | POA: Diagnosis not present

## 2022-03-18 DIAGNOSIS — S8001XA Contusion of right knee, initial encounter: Secondary | ICD-10-CM | POA: Diagnosis not present

## 2022-03-18 DIAGNOSIS — R300 Dysuria: Secondary | ICD-10-CM | POA: Diagnosis not present

## 2022-03-20 DIAGNOSIS — R3129 Other microscopic hematuria: Secondary | ICD-10-CM | POA: Diagnosis not present

## 2022-03-20 DIAGNOSIS — N3289 Other specified disorders of bladder: Secondary | ICD-10-CM | POA: Diagnosis not present

## 2022-03-20 DIAGNOSIS — N8111 Cystocele, midline: Secondary | ICD-10-CM | POA: Diagnosis not present

## 2022-03-20 DIAGNOSIS — N390 Urinary tract infection, site not specified: Secondary | ICD-10-CM | POA: Diagnosis not present

## 2022-03-28 DIAGNOSIS — S81811A Laceration without foreign body, right lower leg, initial encounter: Secondary | ICD-10-CM | POA: Diagnosis not present

## 2022-04-09 DIAGNOSIS — M159 Polyosteoarthritis, unspecified: Secondary | ICD-10-CM | POA: Diagnosis not present

## 2022-04-09 DIAGNOSIS — I10 Essential (primary) hypertension: Secondary | ICD-10-CM | POA: Diagnosis not present

## 2022-04-09 DIAGNOSIS — J309 Allergic rhinitis, unspecified: Secondary | ICD-10-CM | POA: Diagnosis not present

## 2022-04-09 DIAGNOSIS — E7800 Pure hypercholesterolemia, unspecified: Secondary | ICD-10-CM | POA: Diagnosis not present

## 2022-04-09 DIAGNOSIS — E1169 Type 2 diabetes mellitus with other specified complication: Secondary | ICD-10-CM | POA: Diagnosis not present

## 2022-04-09 DIAGNOSIS — N200 Calculus of kidney: Secondary | ICD-10-CM | POA: Diagnosis not present

## 2022-04-09 DIAGNOSIS — K219 Gastro-esophageal reflux disease without esophagitis: Secondary | ICD-10-CM | POA: Diagnosis not present

## 2022-04-09 DIAGNOSIS — K589 Irritable bowel syndrome without diarrhea: Secondary | ICD-10-CM | POA: Diagnosis not present

## 2022-05-14 DIAGNOSIS — J45909 Unspecified asthma, uncomplicated: Secondary | ICD-10-CM | POA: Diagnosis not present

## 2022-05-14 DIAGNOSIS — E7800 Pure hypercholesterolemia, unspecified: Secondary | ICD-10-CM | POA: Diagnosis not present

## 2022-05-14 DIAGNOSIS — R5382 Chronic fatigue, unspecified: Secondary | ICD-10-CM | POA: Diagnosis not present

## 2022-05-14 DIAGNOSIS — I10 Essential (primary) hypertension: Secondary | ICD-10-CM | POA: Diagnosis not present

## 2022-05-14 DIAGNOSIS — M25511 Pain in right shoulder: Secondary | ICD-10-CM | POA: Diagnosis not present

## 2022-05-14 DIAGNOSIS — K589 Irritable bowel syndrome without diarrhea: Secondary | ICD-10-CM | POA: Diagnosis not present

## 2022-05-14 DIAGNOSIS — E1169 Type 2 diabetes mellitus with other specified complication: Secondary | ICD-10-CM | POA: Diagnosis not present

## 2022-05-14 DIAGNOSIS — N184 Chronic kidney disease, stage 4 (severe): Secondary | ICD-10-CM | POA: Diagnosis not present

## 2022-05-14 DIAGNOSIS — E1122 Type 2 diabetes mellitus with diabetic chronic kidney disease: Secondary | ICD-10-CM | POA: Diagnosis not present

## 2022-05-14 DIAGNOSIS — E1140 Type 2 diabetes mellitus with diabetic neuropathy, unspecified: Secondary | ICD-10-CM | POA: Diagnosis not present

## 2022-05-14 DIAGNOSIS — M159 Polyosteoarthritis, unspecified: Secondary | ICD-10-CM | POA: Diagnosis not present

## 2022-05-15 DIAGNOSIS — S0511XA Contusion of eyeball and orbital tissues, right eye, initial encounter: Secondary | ICD-10-CM | POA: Diagnosis not present

## 2022-05-28 DIAGNOSIS — J45909 Unspecified asthma, uncomplicated: Secondary | ICD-10-CM | POA: Diagnosis not present

## 2022-05-28 DIAGNOSIS — E1169 Type 2 diabetes mellitus with other specified complication: Secondary | ICD-10-CM | POA: Diagnosis not present

## 2022-05-28 DIAGNOSIS — M25511 Pain in right shoulder: Secondary | ICD-10-CM | POA: Diagnosis not present

## 2022-05-28 DIAGNOSIS — K589 Irritable bowel syndrome without diarrhea: Secondary | ICD-10-CM | POA: Diagnosis not present

## 2022-05-28 DIAGNOSIS — E875 Hyperkalemia: Secondary | ICD-10-CM | POA: Diagnosis not present

## 2022-05-28 DIAGNOSIS — E7800 Pure hypercholesterolemia, unspecified: Secondary | ICD-10-CM | POA: Diagnosis not present

## 2022-05-28 DIAGNOSIS — E1122 Type 2 diabetes mellitus with diabetic chronic kidney disease: Secondary | ICD-10-CM | POA: Diagnosis not present

## 2022-05-28 DIAGNOSIS — I10 Essential (primary) hypertension: Secondary | ICD-10-CM | POA: Diagnosis not present

## 2022-05-28 DIAGNOSIS — M159 Polyosteoarthritis, unspecified: Secondary | ICD-10-CM | POA: Diagnosis not present

## 2022-05-28 DIAGNOSIS — E1140 Type 2 diabetes mellitus with diabetic neuropathy, unspecified: Secondary | ICD-10-CM | POA: Diagnosis not present

## 2022-05-28 DIAGNOSIS — N184 Chronic kidney disease, stage 4 (severe): Secondary | ICD-10-CM | POA: Diagnosis not present

## 2022-05-30 DIAGNOSIS — R3129 Other microscopic hematuria: Secondary | ICD-10-CM | POA: Diagnosis not present

## 2022-05-30 DIAGNOSIS — N8111 Cystocele, midline: Secondary | ICD-10-CM | POA: Diagnosis not present

## 2022-05-30 DIAGNOSIS — N390 Urinary tract infection, site not specified: Secondary | ICD-10-CM | POA: Diagnosis not present

## 2022-05-30 DIAGNOSIS — N3289 Other specified disorders of bladder: Secondary | ICD-10-CM | POA: Diagnosis not present

## 2022-06-11 DIAGNOSIS — N1831 Chronic kidney disease, stage 3a: Secondary | ICD-10-CM | POA: Diagnosis not present

## 2022-06-14 DIAGNOSIS — J45909 Unspecified asthma, uncomplicated: Secondary | ICD-10-CM | POA: Diagnosis not present

## 2022-06-14 DIAGNOSIS — J309 Allergic rhinitis, unspecified: Secondary | ICD-10-CM | POA: Diagnosis not present

## 2022-06-14 DIAGNOSIS — M159 Polyosteoarthritis, unspecified: Secondary | ICD-10-CM | POA: Diagnosis not present

## 2022-06-14 DIAGNOSIS — E1169 Type 2 diabetes mellitus with other specified complication: Secondary | ICD-10-CM | POA: Diagnosis not present

## 2022-06-14 DIAGNOSIS — K589 Irritable bowel syndrome without diarrhea: Secondary | ICD-10-CM | POA: Diagnosis not present

## 2022-06-14 DIAGNOSIS — K219 Gastro-esophageal reflux disease without esophagitis: Secondary | ICD-10-CM | POA: Diagnosis not present

## 2022-06-14 DIAGNOSIS — N184 Chronic kidney disease, stage 4 (severe): Secondary | ICD-10-CM | POA: Diagnosis not present

## 2022-06-14 DIAGNOSIS — E1140 Type 2 diabetes mellitus with diabetic neuropathy, unspecified: Secondary | ICD-10-CM | POA: Diagnosis not present

## 2022-06-14 DIAGNOSIS — I10 Essential (primary) hypertension: Secondary | ICD-10-CM | POA: Diagnosis not present

## 2022-06-14 DIAGNOSIS — E1122 Type 2 diabetes mellitus with diabetic chronic kidney disease: Secondary | ICD-10-CM | POA: Diagnosis not present

## 2022-06-14 DIAGNOSIS — E7800 Pure hypercholesterolemia, unspecified: Secondary | ICD-10-CM | POA: Diagnosis not present

## 2022-06-18 DIAGNOSIS — M159 Polyosteoarthritis, unspecified: Secondary | ICD-10-CM | POA: Diagnosis not present

## 2022-06-21 DIAGNOSIS — I129 Hypertensive chronic kidney disease with stage 1 through stage 4 chronic kidney disease, or unspecified chronic kidney disease: Secondary | ICD-10-CM | POA: Diagnosis not present

## 2022-06-21 DIAGNOSIS — R809 Proteinuria, unspecified: Secondary | ICD-10-CM | POA: Diagnosis not present

## 2022-06-21 DIAGNOSIS — N390 Urinary tract infection, site not specified: Secondary | ICD-10-CM | POA: Diagnosis not present

## 2022-06-21 DIAGNOSIS — E559 Vitamin D deficiency, unspecified: Secondary | ICD-10-CM | POA: Diagnosis not present

## 2022-06-21 DIAGNOSIS — N200 Calculus of kidney: Secondary | ICD-10-CM | POA: Diagnosis not present

## 2022-06-21 DIAGNOSIS — E1129 Type 2 diabetes mellitus with other diabetic kidney complication: Secondary | ICD-10-CM | POA: Diagnosis not present

## 2022-06-21 DIAGNOSIS — E1122 Type 2 diabetes mellitus with diabetic chronic kidney disease: Secondary | ICD-10-CM | POA: Diagnosis not present

## 2022-06-21 DIAGNOSIS — N1831 Chronic kidney disease, stage 3a: Secondary | ICD-10-CM | POA: Diagnosis not present

## 2022-10-10 DIAGNOSIS — E1169 Type 2 diabetes mellitus with other specified complication: Secondary | ICD-10-CM | POA: Diagnosis not present

## 2022-10-10 DIAGNOSIS — E1140 Type 2 diabetes mellitus with diabetic neuropathy, unspecified: Secondary | ICD-10-CM | POA: Diagnosis not present

## 2022-10-10 DIAGNOSIS — J309 Allergic rhinitis, unspecified: Secondary | ICD-10-CM | POA: Diagnosis not present

## 2022-10-10 DIAGNOSIS — E1122 Type 2 diabetes mellitus with diabetic chronic kidney disease: Secondary | ICD-10-CM | POA: Diagnosis not present

## 2022-10-10 DIAGNOSIS — I10 Essential (primary) hypertension: Secondary | ICD-10-CM | POA: Diagnosis not present

## 2022-10-10 DIAGNOSIS — K589 Irritable bowel syndrome without diarrhea: Secondary | ICD-10-CM | POA: Diagnosis not present

## 2022-10-10 DIAGNOSIS — K219 Gastro-esophageal reflux disease without esophagitis: Secondary | ICD-10-CM | POA: Diagnosis not present

## 2022-10-10 DIAGNOSIS — M159 Polyosteoarthritis, unspecified: Secondary | ICD-10-CM | POA: Diagnosis not present

## 2022-10-10 DIAGNOSIS — N184 Chronic kidney disease, stage 4 (severe): Secondary | ICD-10-CM | POA: Diagnosis not present

## 2022-10-10 DIAGNOSIS — E7800 Pure hypercholesterolemia, unspecified: Secondary | ICD-10-CM | POA: Diagnosis not present

## 2022-10-10 DIAGNOSIS — J45909 Unspecified asthma, uncomplicated: Secondary | ICD-10-CM | POA: Diagnosis not present

## 2022-10-11 DIAGNOSIS — E1169 Type 2 diabetes mellitus with other specified complication: Secondary | ICD-10-CM | POA: Diagnosis not present

## 2022-11-05 DIAGNOSIS — H04129 Dry eye syndrome of unspecified lacrimal gland: Secondary | ICD-10-CM | POA: Diagnosis not present

## 2022-11-05 DIAGNOSIS — Z961 Presence of intraocular lens: Secondary | ICD-10-CM | POA: Diagnosis not present

## 2022-11-05 DIAGNOSIS — E119 Type 2 diabetes mellitus without complications: Secondary | ICD-10-CM | POA: Diagnosis not present

## 2022-11-07 DIAGNOSIS — I10 Essential (primary) hypertension: Secondary | ICD-10-CM | POA: Diagnosis not present

## 2022-11-07 DIAGNOSIS — E1140 Type 2 diabetes mellitus with diabetic neuropathy, unspecified: Secondary | ICD-10-CM | POA: Diagnosis not present

## 2022-11-07 DIAGNOSIS — K219 Gastro-esophageal reflux disease without esophagitis: Secondary | ICD-10-CM | POA: Diagnosis not present

## 2022-11-07 DIAGNOSIS — M159 Polyosteoarthritis, unspecified: Secondary | ICD-10-CM | POA: Diagnosis not present

## 2022-11-07 DIAGNOSIS — E1122 Type 2 diabetes mellitus with diabetic chronic kidney disease: Secondary | ICD-10-CM | POA: Diagnosis not present

## 2022-11-07 DIAGNOSIS — K589 Irritable bowel syndrome without diarrhea: Secondary | ICD-10-CM | POA: Diagnosis not present

## 2022-11-07 DIAGNOSIS — N184 Chronic kidney disease, stage 4 (severe): Secondary | ICD-10-CM | POA: Diagnosis not present

## 2022-11-07 DIAGNOSIS — E7800 Pure hypercholesterolemia, unspecified: Secondary | ICD-10-CM | POA: Diagnosis not present

## 2022-11-07 DIAGNOSIS — J45909 Unspecified asthma, uncomplicated: Secondary | ICD-10-CM | POA: Diagnosis not present

## 2022-11-07 DIAGNOSIS — E1169 Type 2 diabetes mellitus with other specified complication: Secondary | ICD-10-CM | POA: Diagnosis not present

## 2022-11-07 DIAGNOSIS — J309 Allergic rhinitis, unspecified: Secondary | ICD-10-CM | POA: Diagnosis not present

## 2022-11-21 DIAGNOSIS — J309 Allergic rhinitis, unspecified: Secondary | ICD-10-CM | POA: Diagnosis not present

## 2022-11-21 DIAGNOSIS — J45909 Unspecified asthma, uncomplicated: Secondary | ICD-10-CM | POA: Diagnosis not present

## 2022-11-21 DIAGNOSIS — K589 Irritable bowel syndrome without diarrhea: Secondary | ICD-10-CM | POA: Diagnosis not present

## 2022-11-21 DIAGNOSIS — E1122 Type 2 diabetes mellitus with diabetic chronic kidney disease: Secondary | ICD-10-CM | POA: Diagnosis not present

## 2022-11-21 DIAGNOSIS — E1169 Type 2 diabetes mellitus with other specified complication: Secondary | ICD-10-CM | POA: Diagnosis not present

## 2022-11-21 DIAGNOSIS — K219 Gastro-esophageal reflux disease without esophagitis: Secondary | ICD-10-CM | POA: Diagnosis not present

## 2022-11-21 DIAGNOSIS — E7800 Pure hypercholesterolemia, unspecified: Secondary | ICD-10-CM | POA: Diagnosis not present

## 2022-11-21 DIAGNOSIS — I10 Essential (primary) hypertension: Secondary | ICD-10-CM | POA: Diagnosis not present

## 2022-11-21 DIAGNOSIS — M159 Polyosteoarthritis, unspecified: Secondary | ICD-10-CM | POA: Diagnosis not present

## 2022-11-21 DIAGNOSIS — N184 Chronic kidney disease, stage 4 (severe): Secondary | ICD-10-CM | POA: Diagnosis not present

## 2022-11-21 DIAGNOSIS — E1140 Type 2 diabetes mellitus with diabetic neuropathy, unspecified: Secondary | ICD-10-CM | POA: Diagnosis not present

## 2022-11-26 DIAGNOSIS — J40 Bronchitis, not specified as acute or chronic: Secondary | ICD-10-CM | POA: Diagnosis not present

## 2022-11-26 DIAGNOSIS — R053 Chronic cough: Secondary | ICD-10-CM | POA: Diagnosis not present

## 2022-12-05 DIAGNOSIS — K589 Irritable bowel syndrome without diarrhea: Secondary | ICD-10-CM | POA: Diagnosis not present

## 2022-12-05 DIAGNOSIS — K219 Gastro-esophageal reflux disease without esophagitis: Secondary | ICD-10-CM | POA: Diagnosis not present

## 2022-12-05 DIAGNOSIS — N184 Chronic kidney disease, stage 4 (severe): Secondary | ICD-10-CM | POA: Diagnosis not present

## 2022-12-05 DIAGNOSIS — E1169 Type 2 diabetes mellitus with other specified complication: Secondary | ICD-10-CM | POA: Diagnosis not present

## 2022-12-05 DIAGNOSIS — I10 Essential (primary) hypertension: Secondary | ICD-10-CM | POA: Diagnosis not present

## 2022-12-05 DIAGNOSIS — M159 Polyosteoarthritis, unspecified: Secondary | ICD-10-CM | POA: Diagnosis not present

## 2022-12-05 DIAGNOSIS — E1122 Type 2 diabetes mellitus with diabetic chronic kidney disease: Secondary | ICD-10-CM | POA: Diagnosis not present

## 2022-12-05 DIAGNOSIS — E7800 Pure hypercholesterolemia, unspecified: Secondary | ICD-10-CM | POA: Diagnosis not present

## 2022-12-05 DIAGNOSIS — E1140 Type 2 diabetes mellitus with diabetic neuropathy, unspecified: Secondary | ICD-10-CM | POA: Diagnosis not present

## 2022-12-05 DIAGNOSIS — G4733 Obstructive sleep apnea (adult) (pediatric): Secondary | ICD-10-CM | POA: Diagnosis not present

## 2022-12-05 DIAGNOSIS — J45909 Unspecified asthma, uncomplicated: Secondary | ICD-10-CM | POA: Diagnosis not present

## 2022-12-13 DIAGNOSIS — J309 Allergic rhinitis, unspecified: Secondary | ICD-10-CM | POA: Diagnosis not present

## 2022-12-13 DIAGNOSIS — R0602 Shortness of breath: Secondary | ICD-10-CM | POA: Diagnosis not present

## 2022-12-13 DIAGNOSIS — Z7984 Long term (current) use of oral hypoglycemic drugs: Secondary | ICD-10-CM | POA: Diagnosis not present

## 2022-12-13 DIAGNOSIS — I700 Atherosclerosis of aorta: Secondary | ICD-10-CM | POA: Diagnosis not present

## 2022-12-13 DIAGNOSIS — Z008 Encounter for other general examination: Secondary | ICD-10-CM | POA: Diagnosis not present

## 2022-12-13 DIAGNOSIS — Z8249 Family history of ischemic heart disease and other diseases of the circulatory system: Secondary | ICD-10-CM | POA: Diagnosis not present

## 2022-12-13 DIAGNOSIS — Z9181 History of falling: Secondary | ICD-10-CM | POA: Diagnosis not present

## 2022-12-13 DIAGNOSIS — N1831 Chronic kidney disease, stage 3a: Secondary | ICD-10-CM | POA: Diagnosis not present

## 2022-12-13 DIAGNOSIS — E669 Obesity, unspecified: Secondary | ICD-10-CM | POA: Diagnosis not present

## 2022-12-13 DIAGNOSIS — G4733 Obstructive sleep apnea (adult) (pediatric): Secondary | ICD-10-CM | POA: Diagnosis not present

## 2022-12-13 DIAGNOSIS — E785 Hyperlipidemia, unspecified: Secondary | ICD-10-CM | POA: Diagnosis not present

## 2022-12-13 DIAGNOSIS — E1122 Type 2 diabetes mellitus with diabetic chronic kidney disease: Secondary | ICD-10-CM | POA: Diagnosis not present

## 2022-12-13 DIAGNOSIS — I129 Hypertensive chronic kidney disease with stage 1 through stage 4 chronic kidney disease, or unspecified chronic kidney disease: Secondary | ICD-10-CM | POA: Diagnosis not present

## 2022-12-13 DIAGNOSIS — R32 Unspecified urinary incontinence: Secondary | ICD-10-CM | POA: Diagnosis not present

## 2022-12-13 DIAGNOSIS — M1990 Unspecified osteoarthritis, unspecified site: Secondary | ICD-10-CM | POA: Diagnosis not present

## 2022-12-14 DIAGNOSIS — R053 Chronic cough: Secondary | ICD-10-CM | POA: Diagnosis not present

## 2022-12-14 DIAGNOSIS — J988 Other specified respiratory disorders: Secondary | ICD-10-CM | POA: Diagnosis not present

## 2022-12-16 DIAGNOSIS — R0602 Shortness of breath: Secondary | ICD-10-CM | POA: Diagnosis not present

## 2022-12-16 DIAGNOSIS — G4733 Obstructive sleep apnea (adult) (pediatric): Secondary | ICD-10-CM | POA: Diagnosis not present

## 2022-12-19 DIAGNOSIS — G4733 Obstructive sleep apnea (adult) (pediatric): Secondary | ICD-10-CM | POA: Diagnosis not present

## 2022-12-19 DIAGNOSIS — K219 Gastro-esophageal reflux disease without esophagitis: Secondary | ICD-10-CM | POA: Diagnosis not present

## 2022-12-19 DIAGNOSIS — J45909 Unspecified asthma, uncomplicated: Secondary | ICD-10-CM | POA: Diagnosis not present

## 2022-12-19 DIAGNOSIS — M159 Polyosteoarthritis, unspecified: Secondary | ICD-10-CM | POA: Diagnosis not present

## 2022-12-19 DIAGNOSIS — K589 Irritable bowel syndrome without diarrhea: Secondary | ICD-10-CM | POA: Diagnosis not present

## 2022-12-19 DIAGNOSIS — E1122 Type 2 diabetes mellitus with diabetic chronic kidney disease: Secondary | ICD-10-CM | POA: Diagnosis not present

## 2022-12-19 DIAGNOSIS — E7800 Pure hypercholesterolemia, unspecified: Secondary | ICD-10-CM | POA: Diagnosis not present

## 2022-12-19 DIAGNOSIS — E1140 Type 2 diabetes mellitus with diabetic neuropathy, unspecified: Secondary | ICD-10-CM | POA: Diagnosis not present

## 2022-12-19 DIAGNOSIS — E1169 Type 2 diabetes mellitus with other specified complication: Secondary | ICD-10-CM | POA: Diagnosis not present

## 2022-12-19 DIAGNOSIS — N184 Chronic kidney disease, stage 4 (severe): Secondary | ICD-10-CM | POA: Diagnosis not present

## 2022-12-19 DIAGNOSIS — I10 Essential (primary) hypertension: Secondary | ICD-10-CM | POA: Diagnosis not present

## 2022-12-27 DIAGNOSIS — G4733 Obstructive sleep apnea (adult) (pediatric): Secondary | ICD-10-CM | POA: Diagnosis not present

## 2023-01-02 DIAGNOSIS — E1140 Type 2 diabetes mellitus with diabetic neuropathy, unspecified: Secondary | ICD-10-CM | POA: Diagnosis not present

## 2023-01-02 DIAGNOSIS — G4733 Obstructive sleep apnea (adult) (pediatric): Secondary | ICD-10-CM | POA: Diagnosis not present

## 2023-01-02 DIAGNOSIS — J45909 Unspecified asthma, uncomplicated: Secondary | ICD-10-CM | POA: Diagnosis not present

## 2023-01-02 DIAGNOSIS — E7800 Pure hypercholesterolemia, unspecified: Secondary | ICD-10-CM | POA: Diagnosis not present

## 2023-01-02 DIAGNOSIS — N184 Chronic kidney disease, stage 4 (severe): Secondary | ICD-10-CM | POA: Diagnosis not present

## 2023-01-02 DIAGNOSIS — M159 Polyosteoarthritis, unspecified: Secondary | ICD-10-CM | POA: Diagnosis not present

## 2023-01-02 DIAGNOSIS — E1169 Type 2 diabetes mellitus with other specified complication: Secondary | ICD-10-CM | POA: Diagnosis not present

## 2023-01-02 DIAGNOSIS — I10 Essential (primary) hypertension: Secondary | ICD-10-CM | POA: Diagnosis not present

## 2023-01-02 DIAGNOSIS — E1122 Type 2 diabetes mellitus with diabetic chronic kidney disease: Secondary | ICD-10-CM | POA: Diagnosis not present

## 2023-01-02 DIAGNOSIS — K219 Gastro-esophageal reflux disease without esophagitis: Secondary | ICD-10-CM | POA: Diagnosis not present

## 2023-01-02 DIAGNOSIS — K589 Irritable bowel syndrome without diarrhea: Secondary | ICD-10-CM | POA: Diagnosis not present

## 2023-01-16 DIAGNOSIS — M159 Polyosteoarthritis, unspecified: Secondary | ICD-10-CM | POA: Diagnosis not present

## 2023-01-16 DIAGNOSIS — E1140 Type 2 diabetes mellitus with diabetic neuropathy, unspecified: Secondary | ICD-10-CM | POA: Diagnosis not present

## 2023-01-16 DIAGNOSIS — K219 Gastro-esophageal reflux disease without esophagitis: Secondary | ICD-10-CM | POA: Diagnosis not present

## 2023-01-16 DIAGNOSIS — E1169 Type 2 diabetes mellitus with other specified complication: Secondary | ICD-10-CM | POA: Diagnosis not present

## 2023-01-16 DIAGNOSIS — E1122 Type 2 diabetes mellitus with diabetic chronic kidney disease: Secondary | ICD-10-CM | POA: Diagnosis not present

## 2023-01-16 DIAGNOSIS — E7800 Pure hypercholesterolemia, unspecified: Secondary | ICD-10-CM | POA: Diagnosis not present

## 2023-01-16 DIAGNOSIS — I10 Essential (primary) hypertension: Secondary | ICD-10-CM | POA: Diagnosis not present

## 2023-01-16 DIAGNOSIS — K589 Irritable bowel syndrome without diarrhea: Secondary | ICD-10-CM | POA: Diagnosis not present

## 2023-01-16 DIAGNOSIS — J45909 Unspecified asthma, uncomplicated: Secondary | ICD-10-CM | POA: Diagnosis not present

## 2023-01-16 DIAGNOSIS — G4733 Obstructive sleep apnea (adult) (pediatric): Secondary | ICD-10-CM | POA: Diagnosis not present

## 2023-01-16 DIAGNOSIS — N184 Chronic kidney disease, stage 4 (severe): Secondary | ICD-10-CM | POA: Diagnosis not present

## 2023-01-21 DIAGNOSIS — R051 Acute cough: Secondary | ICD-10-CM | POA: Diagnosis not present

## 2023-01-21 DIAGNOSIS — J309 Allergic rhinitis, unspecified: Secondary | ICD-10-CM | POA: Diagnosis not present

## 2023-01-27 DIAGNOSIS — G4733 Obstructive sleep apnea (adult) (pediatric): Secondary | ICD-10-CM | POA: Diagnosis not present

## 2023-01-30 DIAGNOSIS — E1169 Type 2 diabetes mellitus with other specified complication: Secondary | ICD-10-CM | POA: Diagnosis not present

## 2023-01-30 DIAGNOSIS — G4733 Obstructive sleep apnea (adult) (pediatric): Secondary | ICD-10-CM | POA: Diagnosis not present

## 2023-01-30 DIAGNOSIS — M159 Polyosteoarthritis, unspecified: Secondary | ICD-10-CM | POA: Diagnosis not present

## 2023-01-30 DIAGNOSIS — E1140 Type 2 diabetes mellitus with diabetic neuropathy, unspecified: Secondary | ICD-10-CM | POA: Diagnosis not present

## 2023-01-30 DIAGNOSIS — K589 Irritable bowel syndrome without diarrhea: Secondary | ICD-10-CM | POA: Diagnosis not present

## 2023-01-30 DIAGNOSIS — I10 Essential (primary) hypertension: Secondary | ICD-10-CM | POA: Diagnosis not present

## 2023-01-30 DIAGNOSIS — N184 Chronic kidney disease, stage 4 (severe): Secondary | ICD-10-CM | POA: Diagnosis not present

## 2023-01-30 DIAGNOSIS — E1122 Type 2 diabetes mellitus with diabetic chronic kidney disease: Secondary | ICD-10-CM | POA: Diagnosis not present

## 2023-01-30 DIAGNOSIS — J45909 Unspecified asthma, uncomplicated: Secondary | ICD-10-CM | POA: Diagnosis not present

## 2023-01-30 DIAGNOSIS — E7800 Pure hypercholesterolemia, unspecified: Secondary | ICD-10-CM | POA: Diagnosis not present

## 2023-01-30 DIAGNOSIS — N3281 Overactive bladder: Secondary | ICD-10-CM | POA: Diagnosis not present

## 2023-02-13 DIAGNOSIS — N184 Chronic kidney disease, stage 4 (severe): Secondary | ICD-10-CM | POA: Diagnosis not present

## 2023-02-13 DIAGNOSIS — E1169 Type 2 diabetes mellitus with other specified complication: Secondary | ICD-10-CM | POA: Diagnosis not present

## 2023-02-13 DIAGNOSIS — R053 Chronic cough: Secondary | ICD-10-CM | POA: Diagnosis not present

## 2023-02-13 DIAGNOSIS — K589 Irritable bowel syndrome without diarrhea: Secondary | ICD-10-CM | POA: Diagnosis not present

## 2023-02-13 DIAGNOSIS — G4733 Obstructive sleep apnea (adult) (pediatric): Secondary | ICD-10-CM | POA: Diagnosis not present

## 2023-02-13 DIAGNOSIS — E1122 Type 2 diabetes mellitus with diabetic chronic kidney disease: Secondary | ICD-10-CM | POA: Diagnosis not present

## 2023-02-13 DIAGNOSIS — I10 Essential (primary) hypertension: Secondary | ICD-10-CM | POA: Diagnosis not present

## 2023-02-13 DIAGNOSIS — N3281 Overactive bladder: Secondary | ICD-10-CM | POA: Diagnosis not present

## 2023-02-13 DIAGNOSIS — J45909 Unspecified asthma, uncomplicated: Secondary | ICD-10-CM | POA: Diagnosis not present

## 2023-02-13 DIAGNOSIS — E7800 Pure hypercholesterolemia, unspecified: Secondary | ICD-10-CM | POA: Diagnosis not present

## 2023-02-13 DIAGNOSIS — E1140 Type 2 diabetes mellitus with diabetic neuropathy, unspecified: Secondary | ICD-10-CM | POA: Diagnosis not present

## 2023-02-18 DIAGNOSIS — Z1231 Encounter for screening mammogram for malignant neoplasm of breast: Secondary | ICD-10-CM | POA: Diagnosis not present

## 2023-03-13 ENCOUNTER — Encounter: Payer: Self-pay | Admitting: Pulmonary Disease

## 2023-03-13 ENCOUNTER — Ambulatory Visit: Payer: Medicare HMO | Admitting: Pulmonary Disease

## 2023-03-13 VITALS — BP 112/58 | HR 57 | Temp 98.1°F | Ht 62.0 in | Wt 198.0 lb

## 2023-03-13 DIAGNOSIS — R053 Chronic cough: Secondary | ICD-10-CM | POA: Diagnosis not present

## 2023-03-13 DIAGNOSIS — G4733 Obstructive sleep apnea (adult) (pediatric): Secondary | ICD-10-CM

## 2023-03-13 MED ORDER — FLUTICASONE-SALMETEROL 250-50 MCG/ACT IN AEPB: 1.0000 | INHALATION_SPRAY | Freq: Two times a day (BID) | RESPIRATORY_TRACT | 11 refills | Status: DC

## 2023-03-13 NOTE — Progress Notes (Signed)
@Patient  ID: Tanya Munoz, female    DOB: 1941/07/06, 82 y.o.   MRN: [id number]  Chief Complaint  Patient presents with   Pulmonary Consult    Referred by Dr. Simone Curia. Pt c/o cough since March 2024. Cough is non prod and tends to be worse in the evening and with changes in temp. She is having a hard time using her CPAP due to coughing in the night.     Referring provider: Simone Curia, MD  HPI:   82 y.o. woman whom we are seeing for evaluation of chronic cough.  Note from referring provider reviewed.  Urgent care note 11/2022 reviewed.  Patient notes onset of cough late February 2024.  Out of the blue.  Denies significant fever chills etc. to implicate the upper respiratory infection or viral infection.  Dry cough.  Presented to urgent care.  Chest x-ray was obtained on my review interpretation reveals clear lungs with possible slight hyperinflation.  Similar symptoms persisted despite treatment with doxycycline and rescue inhaler 3 presented to ED 1 month later.  Chest x-ray at that time on my review interpretation reveals clear lungs bilaterally.  Cough seems worse in the evenings.  And with changes in temperature.  No other times a day when things are better or worse.  No position makes his better or worse.  No other seasonal or environmental factors she can identify to make things better or worse.  She is unsure if the inhaler really has helped at all.  No other really alleviating or exacerbating factors.  At 1 point time, she is on lisinopril this medication was stopped.  However not significant or longstanding improvement in symptoms despite this.  She does report medication adherence to her multiple blood pressure medicines and with decrease and taking her blood pressure medicine thinks the cough has improved a little bit.  Past medical history: Diabetes, hypertension Surgical history: Hysterectomy, knee replacement Family history:History reviewed. No pertinent family history. Social  history: Never smoker, retired, lives in [place] / Pulmonary Flowsheets:   ACT:      No data to display          MMRC:     No data to display          Epworth:      No data to display          Tests:   FENO:  No results found for: "NITRICOXIDE"  PFT:     No data to display          WALK:      No data to display          Imaging: No results found.  Lab Results:  CBC    Component Value Date/Time   WBC 6.4 08/20/2011 0610   RBC 3.32 (L) 08/20/2011 0610   HGB 9.5 (L) 08/20/2011 0610   HCT 30.0 (L) 08/20/2011 0610   PLT 240 08/20/2011 0610   MCV 90.4 08/20/2011 0610   MCH 28.6 08/20/2011 0610   MCHC 31.7 08/20/2011 0610   RDW 13.2 08/20/2011 0610   LYMPHSABS 1.3 08/17/2011 1638   MONOABS 0.7 08/17/2011 1638   EOSABS 0.1 08/17/2011 1638   BASOSABS 0.0 08/17/2011 1638    BMET    Component Value Date/Time   NA 134 (L) 08/20/2011 0610   K 4.0 08/20/2011 0610   CL 98 08/20/2011 0610   CO2 26 08/20/2011 0610   GLUCOSE 128 (H) 08/20/2011 0610   BUN 17  08/20/2011 0610   CREATININE 0.84 08/20/2011 0610   CALCIUM 9.1 08/20/2011 0610   GFRNONAA 68 (L) 08/20/2011 0610   GFRAA 79 (L) 08/20/2011 0610    BNP No results found for: "BNP"  ProBNP No results found for: "PROBNP"  Specialty Problems       Pulmonary Problems   Dyspnea on exertion   OSA on CPAP    No Known Allergies  Immunization History  Administered Date(s) Administered   Fluad Quad(high Dose 65+) 06/18/2019   Influenza, High Dose Seasonal PF 07/27/2018   Moderna Sars-Covid-2 Vaccination 11/06/2019, 12/02/2019    Past Medical History:  Diagnosis Date   Arthritis    arthritis in knee and shoulder   Chronic kidney disease    uti about a month ago - treated with abx   Diabetes mellitus    diagnosed 6-7 yrs ago - takes metformin   GERD (gastroesophageal reflux disease)    h/o acid reflux - took zantac   Hyperlipidemia    Hypertension    saw  a cardiologist in [place] for surgical clearance    Tobacco History: Social History   Tobacco Use  Smoking Status Never  Smokeless Tobacco Never   Counseling given: Not Answered   Continue to not smoke  Outpatient Encounter Medications as of 03/13/2023  Medication Sig   amLODipine (NORVASC) 10 MG tablet Take 10 mg by mouth daily.   aspirin 81 MG tablet Take 81 mg by mouth daily.     atorvastatin (LIPITOR) 40 MG tablet Take 40 mg by mouth daily.   fluticasone-salmeterol (ADVAIR) 250-50 MCG/ACT AEPB Inhale 1 puff into the lungs every 12 (twelve) hours.   metFORMIN (GLUCOPHAGE) 850 MG tablet Take 850 mg by mouth 2 (two) times daily with a meal.     metoprolol tartrate (LOPRESSOR) 50 MG tablet Take 50 mg by mouth 2 (two) times daily.   trimethoprim (TRIMPEX) 100 MG tablet Take 100 mg by mouth daily.   valsartan (DIOVAN) 320 MG tablet Take 320 mg by mouth daily.   albuterol (VENTOLIN HFA) 108 (90 Base) MCG/ACT inhaler Inhale 2 puffs into the lungs every 6 (six) hours as needed for wheezing or shortness of breath. (Patient not taking: Reported on 03/13/2023)   [DISCONTINUED] benzonatate (TESSALON) 100 MG capsule Take 1 capsule (100 mg total) by mouth 3 (three) times daily as needed for cough.   [DISCONTINUED] carvedilol (COREG) 3.125 MG tablet Take 3.125 mg by mouth 2 (two) times daily with a meal.     [DISCONTINUED] losartan-hydrochlorothiazide (HYZAAR) 100-25 MG per tablet Take 1 tablet by mouth daily.     [DISCONTINUED] lovastatin (MEVACOR) 40 MG tablet Take 40 mg by mouth at bedtime.     [DISCONTINUED] montelukast (SINGULAIR) 10 MG tablet Take 10 mg by mouth at bedtime.   [DISCONTINUED] traMADol (ULTRAM) 50 MG tablet Take 50 mg by mouth every 6 (six) hours as needed. Maximum dose= 8 tablets per day For pain    No facility-administered encounter medications on file as of 03/13/2023.     Review of Systems  Review of Systems  No chest pain with exertion no orthopnea or PND.   Comprehensive review of systems otherwise negative. Physical Exam  BP (!) 112/58 (BP Location: Right Arm, Cuff Size: Normal)   Pulse (!) 57   Temp 98.1 F (36.7 C) (Oral)   Ht 5\' 2"  (1.575 m)   Wt 198 lb (89.8 kg)   SpO2 95% Comment: on RA  BMI 36.21 kg/m   Wt Readings from  Last 5 Encounters:  03/13/23 198 lb (89.8 kg)  01/26/20 178 lb 12.8 oz (81.1 kg)  10/20/19 198 lb (89.8 kg)  10/01/19 194 lb 3.2 oz (88.1 kg)  06/18/19 199 lb 3.2 oz (90.4 kg)    BMI Readings from Last 5 Encounters:  03/13/23 36.21 kg/m  01/26/20 30.69 kg/m  10/20/19 32.95 kg/m  10/01/19 33.33 kg/m  06/18/19 34.19 kg/m     Physical Exam General: Sitting in chair, no acute distress Eyes: EOMI, no icterus Neck: Supple, no JVP Pulmonary: Clear, no work of breathing Cardiovascular: Warm, no edema Abdomen: Nondistended, soft present MSK: No synovitis, no joint effusion Neuro: Normal gait, no weakness Psych: Normal mood, full affect   Assessment & Plan:   Chronic cough: Dry.  Chest x-ray clear x 2.  Present for approaching 4 months.  Denies postnasal drip or GERD symptoms.  Mild hyperinflation on chest x-ray in the past and more recently.  Possible cough variant asthma.  Worse at night.  Trial mid dose Advair discus twice daily.  Assess response.  OSA on CPAP: She reports good adherence.  Managed elsewhere.  Encouraged to continue CPAP therapy.   Return in about 3 months (around 06/13/2023).   Karren Burly, MD 03/13/2023

## 2023-03-13 NOTE — Patient Instructions (Signed)
Nice to meet you  Based on the chest xray and lack of other symptoms, asthma is possible  To treat this Use Advair diskus 1 puff in the morning and 1 puff in the evening.   If too expensive do not pick it up and let me know   If in 4 weeks the cough is no better please let me know  Return to clinic in 3 months or sooner as needed

## 2023-03-29 DIAGNOSIS — G4733 Obstructive sleep apnea (adult) (pediatric): Secondary | ICD-10-CM | POA: Diagnosis not present

## 2023-04-10 DIAGNOSIS — I10 Essential (primary) hypertension: Secondary | ICD-10-CM | POA: Diagnosis not present

## 2023-04-10 DIAGNOSIS — N390 Urinary tract infection, site not specified: Secondary | ICD-10-CM | POA: Diagnosis not present

## 2023-04-10 DIAGNOSIS — N184 Chronic kidney disease, stage 4 (severe): Secondary | ICD-10-CM | POA: Diagnosis not present

## 2023-04-10 DIAGNOSIS — E1122 Type 2 diabetes mellitus with diabetic chronic kidney disease: Secondary | ICD-10-CM | POA: Diagnosis not present

## 2023-04-10 DIAGNOSIS — E1169 Type 2 diabetes mellitus with other specified complication: Secondary | ICD-10-CM | POA: Diagnosis not present

## 2023-04-10 DIAGNOSIS — N3281 Overactive bladder: Secondary | ICD-10-CM | POA: Diagnosis not present

## 2023-04-10 DIAGNOSIS — R053 Chronic cough: Secondary | ICD-10-CM | POA: Diagnosis not present

## 2023-04-10 DIAGNOSIS — E7800 Pure hypercholesterolemia, unspecified: Secondary | ICD-10-CM | POA: Diagnosis not present

## 2023-04-10 DIAGNOSIS — G4733 Obstructive sleep apnea (adult) (pediatric): Secondary | ICD-10-CM | POA: Diagnosis not present

## 2023-04-11 DIAGNOSIS — G4733 Obstructive sleep apnea (adult) (pediatric): Secondary | ICD-10-CM | POA: Diagnosis not present

## 2023-04-24 DIAGNOSIS — M8589 Other specified disorders of bone density and structure, multiple sites: Secondary | ICD-10-CM | POA: Diagnosis not present

## 2023-04-24 DIAGNOSIS — Z0189 Encounter for other specified special examinations: Secondary | ICD-10-CM | POA: Diagnosis not present

## 2023-04-28 DIAGNOSIS — G4733 Obstructive sleep apnea (adult) (pediatric): Secondary | ICD-10-CM | POA: Diagnosis not present

## 2023-05-08 DIAGNOSIS — E875 Hyperkalemia: Secondary | ICD-10-CM | POA: Diagnosis not present

## 2023-05-08 DIAGNOSIS — R053 Chronic cough: Secondary | ICD-10-CM | POA: Diagnosis not present

## 2023-05-08 DIAGNOSIS — N3281 Overactive bladder: Secondary | ICD-10-CM | POA: Diagnosis not present

## 2023-05-08 DIAGNOSIS — E1169 Type 2 diabetes mellitus with other specified complication: Secondary | ICD-10-CM | POA: Diagnosis not present

## 2023-05-08 DIAGNOSIS — E1122 Type 2 diabetes mellitus with diabetic chronic kidney disease: Secondary | ICD-10-CM | POA: Diagnosis not present

## 2023-05-08 DIAGNOSIS — E1140 Type 2 diabetes mellitus with diabetic neuropathy, unspecified: Secondary | ICD-10-CM | POA: Diagnosis not present

## 2023-05-08 DIAGNOSIS — E7800 Pure hypercholesterolemia, unspecified: Secondary | ICD-10-CM | POA: Diagnosis not present

## 2023-05-08 DIAGNOSIS — I10 Essential (primary) hypertension: Secondary | ICD-10-CM | POA: Diagnosis not present

## 2023-05-08 DIAGNOSIS — J45909 Unspecified asthma, uncomplicated: Secondary | ICD-10-CM | POA: Diagnosis not present

## 2023-05-08 DIAGNOSIS — N184 Chronic kidney disease, stage 4 (severe): Secondary | ICD-10-CM | POA: Diagnosis not present

## 2023-05-08 DIAGNOSIS — G4733 Obstructive sleep apnea (adult) (pediatric): Secondary | ICD-10-CM | POA: Diagnosis not present

## 2023-05-29 DIAGNOSIS — G4733 Obstructive sleep apnea (adult) (pediatric): Secondary | ICD-10-CM | POA: Diagnosis not present

## 2023-06-05 DIAGNOSIS — E875 Hyperkalemia: Secondary | ICD-10-CM | POA: Diagnosis not present

## 2023-06-05 DIAGNOSIS — Z6839 Body mass index (BMI) 39.0-39.9, adult: Secondary | ICD-10-CM | POA: Diagnosis not present

## 2023-06-05 DIAGNOSIS — I10 Essential (primary) hypertension: Secondary | ICD-10-CM | POA: Diagnosis not present

## 2023-06-05 DIAGNOSIS — E1122 Type 2 diabetes mellitus with diabetic chronic kidney disease: Secondary | ICD-10-CM | POA: Diagnosis not present

## 2023-06-05 DIAGNOSIS — E1169 Type 2 diabetes mellitus with other specified complication: Secondary | ICD-10-CM | POA: Diagnosis not present

## 2023-06-05 DIAGNOSIS — E7800 Pure hypercholesterolemia, unspecified: Secondary | ICD-10-CM | POA: Diagnosis not present

## 2023-06-05 DIAGNOSIS — N184 Chronic kidney disease, stage 4 (severe): Secondary | ICD-10-CM | POA: Diagnosis not present

## 2023-06-05 DIAGNOSIS — G4733 Obstructive sleep apnea (adult) (pediatric): Secondary | ICD-10-CM | POA: Diagnosis not present

## 2023-06-05 DIAGNOSIS — M159 Polyosteoarthritis, unspecified: Secondary | ICD-10-CM | POA: Diagnosis not present

## 2023-06-05 DIAGNOSIS — N3281 Overactive bladder: Secondary | ICD-10-CM | POA: Diagnosis not present

## 2023-06-05 DIAGNOSIS — R053 Chronic cough: Secondary | ICD-10-CM | POA: Diagnosis not present

## 2023-06-13 ENCOUNTER — Ambulatory Visit: Payer: Medicare HMO | Admitting: Pulmonary Disease

## 2023-06-13 ENCOUNTER — Encounter: Payer: Self-pay | Admitting: Pulmonary Disease

## 2023-06-13 VITALS — BP 142/62 | HR 60 | Temp 97.7°F | Ht 62.0 in | Wt 201.4 lb

## 2023-06-13 DIAGNOSIS — G4733 Obstructive sleep apnea (adult) (pediatric): Secondary | ICD-10-CM | POA: Diagnosis not present

## 2023-06-13 DIAGNOSIS — R053 Chronic cough: Secondary | ICD-10-CM

## 2023-06-13 DIAGNOSIS — R0609 Other forms of dyspnea: Secondary | ICD-10-CM | POA: Diagnosis not present

## 2023-06-13 MED ORDER — TRELEGY ELLIPTA 200-62.5-25 MCG/ACT IN AEPB: 1.0000 | INHALATION_SPRAY | Freq: Every day | RESPIRATORY_TRACT | Status: DC

## 2023-06-13 NOTE — Addendum Note (Signed)
Addended by: Charlott Holler on: 06/13/2023 10:31 AM   Modules accepted: Orders

## 2023-06-13 NOTE — Progress Notes (Signed)
@Patient  ID: Tanya Munoz, female    DOB: Mar 18, 1941, 82 y.o.   MRN: [id number]  Chief Complaint  Patient presents with   Follow-up    Cough-dry better, occass. Sob with exertion    Referring provider: No ref. provider found  HPI:   82 y.o. woman whom we are seeing for evaluation of chronic cough.    Overall doing well.  Placed on the Advair discus last visit given concern for cough variant asthma.  Coughing is resolved.  3 months gone.  Pleased by this.  Does endorse some dyspnea on exertion.  With inclines or stairs particularly.  She reports routinely doing water aerobics twice a week at the gym, [employer].  She reports good adherence to CPAP therapy.  82 y.o. Patient notes onset of cough late February 2024.  Out of the blue.  Denies significant fever chills etc. to implicate the upper respiratory infection or viral infection.  Dry cough.  Presented to urgent care.  Chest x-ray was obtained on my review interpretation reveals clear lungs with possible slight hyperinflation.  Similar symptoms persisted despite treatment with doxycycline and rescue inhaler 3 presented to ED 1 month later.  Chest x-ray at that time on my review interpretation reveals clear lungs bilaterally.  Cough seems worse in the evenings.  And with changes in temperature.  No other times a day when things are better or worse.  No position makes his better or worse.  No other seasonal or environmental factors she can identify to make things better or worse.  She is unsure if the inhaler really has helped at all.  No other really alleviating or exacerbating factors.  At 1 point time, she is on lisinopril this medication was stopped.  However not significant or longstanding improvement in symptoms despite this.  She does report medication adherence to her multiple blood pressure medicines and with decrease and taking her blood pressure medicine thinks the cough has improved a little bit.  Past medical history:  Diabetes, hypertension Surgical history: Hysterectomy, knee replacement Family history:History reviewed. No pertinent family history. Social history: Never smoker, retired, lives in [place] / Pulmonary Flowsheets:   ACT:      No data to display          MMRC:     No data to display          Epworth:      No data to display          Tests:   FENO:  No results found for: "NITRICOXIDE"  PFT:     No data to display          WALK:      No data to display          Imaging: No results found.  Lab Results:  CBC    Component Value Date/Time   WBC 6.4 08/20/2011 0610   RBC 3.32 (L) 08/20/2011 0610   HGB 9.5 (L) 08/20/2011 0610   HCT 30.0 (L) 08/20/2011 0610   PLT 240 08/20/2011 0610   MCV 90.4 08/20/2011 0610   MCH 28.6 08/20/2011 0610   MCHC 31.7 08/20/2011 0610   RDW 13.2 08/20/2011 0610   LYMPHSABS 1.3 08/17/2011 1638   MONOABS 0.7 08/17/2011 1638   EOSABS 0.1 08/17/2011 1638   BASOSABS 0.0 08/17/2011 1638    BMET    Component Value Date/Time   NA 134 (L) 08/20/2011 0610   K 4.0 08/20/2011 0610  CL 98 08/20/2011 0610   CO2 26 08/20/2011 0610   GLUCOSE 128 (H) 08/20/2011 0610   BUN 17 08/20/2011 0610   CREATININE 0.84 08/20/2011 0610   CALCIUM 9.1 08/20/2011 0610   GFRNONAA 68 (L) 08/20/2011 0610   GFRAA 79 (L) 08/20/2011 0610    BNP No results found for: "BNP"  ProBNP No results found for: "PROBNP"  Specialty Problems       Pulmonary Problems   Dyspnea on exertion   OSA on CPAP    No Known Allergies  Immunization History  Administered Date(s) Administered   Fluad Quad(high Dose 65+) 06/18/2019   Influenza, High Dose Seasonal PF 07/27/2018   Influenza-Unspecified 05/26/2023   Moderna Sars-Covid-2 Vaccination 11/06/2019, 12/02/2019    Past Medical History:  Diagnosis Date   Arthritis    arthritis in knee and shoulder   Chronic kidney disease    uti about a month ago - treated with abx    Diabetes mellitus    diagnosed 6-7 yrs ago - takes metformin   GERD (gastroesophageal reflux disease)    h/o acid reflux - took zantac   Hyperlipidemia    Hypertension    saw a cardiologist in [place] for surgical clearance    Tobacco History: Social History   Tobacco Use  Smoking Status Never  Smokeless Tobacco Never   Counseling given: Not Answered   Continue to not smoke  Outpatient Encounter Medications as of 06/13/2023  Medication Sig   amLODipine (NORVASC) 10 MG tablet Take 10 mg by mouth daily.   aspirin 81 MG tablet Take 81 mg by mouth daily.     atorvastatin (LIPITOR) 40 MG tablet Take 40 mg by mouth daily.   fluticasone-salmeterol (ADVAIR) 250-50 MCG/ACT AEPB Inhale 1 puff into the lungs every 12 (twelve) hours.   metFORMIN (GLUCOPHAGE) 850 MG tablet Take 850 mg by mouth 2 (two) times daily with a meal.     metoprolol tartrate (LOPRESSOR) 50 MG tablet Take 50 mg by mouth 2 (two) times daily.   traMADol (ULTRAM) 50 MG tablet Take 50 mg by mouth every 6 (six) hours as needed.   trimethoprim (TRIMPEX) 100 MG tablet Take 100 mg by mouth daily.   valsartan (DIOVAN) 320 MG tablet Take 320 mg by mouth daily.   albuterol (VENTOLIN HFA) 108 (90 Base) MCG/ACT inhaler Inhale 2 puffs into the lungs every 6 (six) hours as needed for wheezing or shortness of breath. (Patient not taking: Reported on 06/13/2023)   No facility-administered encounter medications on file as of 06/13/2023.     Review of Systems  Review of Systems  No chest pain with exertion no orthopnea or PND.  Comprehensive review of systems otherwise negative. Physical Exam  BP (!) 142/62 (BP Location: Left Arm, Cuff Size: Large)   Pulse 60   Temp 97.7 F (36.5 C) (Temporal)   Ht 5\' 2"  (1.575 m)   Wt 201 lb 6.4 oz (91.4 kg)   SpO2 99%   BMI 36.84 kg/m   Wt Readings from Last 5 Encounters:  06/13/23 201 lb 6.4 oz (91.4 kg)  03/13/23 198 lb (89.8 kg)  01/26/20 178 lb 12.8 oz (81.1 kg)  10/20/19 198 lb  (89.8 kg)  10/01/19 194 lb 3.2 oz (88.1 kg)    BMI Readings from Last 5 Encounters:  06/13/23 36.84 kg/m  03/13/23 36.21 kg/m  01/26/20 30.69 kg/m  10/20/19 32.95 kg/m  10/01/19 33.33 kg/m     Physical Exam General: Sitting in chair, no acute distress  Eyes: EOMI, no icterus Neck: Supple, no JVP Pulmonary: Clear, no work of breathing Cardiovascular: Warm, no edema Abdomen: Nondistended, soft present MSK: No synovitis, no joint effusion Neuro: Normal gait, no weakness Psych: Normal mood, full affect   Assessment & Plan:   Chronic cough: Dry.  Chest x-ray clear x 2.  Present for approaching 4 months.  Denies postnasal drip or GERD symptoms.  Mild hyperinflation on chest x-ray in the past and more recently.  Felt could be cough variant asthma.  Worse at night.  Cough essentially resolved with mid dose Advair discus twice daily.  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  Dyspnea on exertion: New complaint today.  Mild.  Worse with inclines or stairs or increased exertion.  I suspicion for deconditioning, possible contribution of habitus.  Possibly related to asthma as discussed above.  Can try Trelegy, high-dose.  Response.  If no better can resume Advair and can pursue additional testing (PFT, cardiac evaluation) after shared decision making.  OSA on CPAP: She reports good adherence.  Managed elsewhere.  Encouraged to continue CPAP therapy.   Return in about 3 months (around 09/12/2023) for f/u Dr. Judeth Horn.   Karren Burly, MD 06/13/2023

## 2023-06-13 NOTE — Patient Instructions (Addendum)
Nice to see you again  Lets try the inhaler use Trelegy 1 puff once a day.  Rinse your mouth out with water after every use.  This replaces Advair, stop Advair while using the Trelegy  Please see me a message in the next couple weeks let me know if you think it is working better for your shortness of breath  I am glad the cough has gotten much better with the Advair.  If the Trelegy does not help anymore we can just continue Advair moving forward  Return to clinic in 3 months or sooner with Dr. Judeth Horn

## 2023-06-19 DIAGNOSIS — E1122 Type 2 diabetes mellitus with diabetic chronic kidney disease: Secondary | ICD-10-CM | POA: Diagnosis not present

## 2023-06-19 DIAGNOSIS — R053 Chronic cough: Secondary | ICD-10-CM | POA: Diagnosis not present

## 2023-06-19 DIAGNOSIS — E1169 Type 2 diabetes mellitus with other specified complication: Secondary | ICD-10-CM | POA: Diagnosis not present

## 2023-06-19 DIAGNOSIS — E1140 Type 2 diabetes mellitus with diabetic neuropathy, unspecified: Secondary | ICD-10-CM | POA: Diagnosis not present

## 2023-06-19 DIAGNOSIS — M159 Polyosteoarthritis, unspecified: Secondary | ICD-10-CM | POA: Diagnosis not present

## 2023-06-19 DIAGNOSIS — E875 Hyperkalemia: Secondary | ICD-10-CM | POA: Diagnosis not present

## 2023-06-19 DIAGNOSIS — I10 Essential (primary) hypertension: Secondary | ICD-10-CM | POA: Diagnosis not present

## 2023-06-19 DIAGNOSIS — N184 Chronic kidney disease, stage 4 (severe): Secondary | ICD-10-CM | POA: Diagnosis not present

## 2023-06-19 DIAGNOSIS — E7800 Pure hypercholesterolemia, unspecified: Secondary | ICD-10-CM | POA: Diagnosis not present

## 2023-06-19 DIAGNOSIS — G4733 Obstructive sleep apnea (adult) (pediatric): Secondary | ICD-10-CM | POA: Diagnosis not present

## 2023-06-19 DIAGNOSIS — N3281 Overactive bladder: Secondary | ICD-10-CM | POA: Diagnosis not present

## 2023-06-29 DIAGNOSIS — G4733 Obstructive sleep apnea (adult) (pediatric): Secondary | ICD-10-CM | POA: Diagnosis not present

## 2023-07-03 ENCOUNTER — Telehealth: Payer: Self-pay | Admitting: Pulmonary Disease

## 2023-07-03 DIAGNOSIS — R0609 Other forms of dyspnea: Secondary | ICD-10-CM

## 2023-07-03 NOTE — Telephone Encounter (Signed)
Patient wanted to let Dr. Judeth Horn know that she is still short on breath while taking her inhaler. It's not as bad as it was but there is still some shortness.

## 2023-07-08 MED ORDER — TRELEGY ELLIPTA 200-62.5-25 MCG/ACT IN AEPB: 1.0000 | INHALATION_SPRAY | Freq: Every day | RESPIRATORY_TRACT | 11 refills | Status: AC

## 2023-07-08 NOTE — Telephone Encounter (Signed)
Called patient, went to voicemail.  Detailed voicemail left.  Continue Trelegy, new prescription sent to local pharmacy today.  Pulmonary function test ordered for further evaluation.  Offered referral to cardiology for evaluation of cardiac reasons for dyspnea.  I asked her to call us back and if okay we can place that referral.

## 2023-07-10 ENCOUNTER — Telehealth: Payer: Self-pay | Admitting: Pulmonary Disease

## 2023-07-10 DIAGNOSIS — I10 Essential (primary) hypertension: Secondary | ICD-10-CM | POA: Diagnosis not present

## 2023-07-10 DIAGNOSIS — N3281 Overactive bladder: Secondary | ICD-10-CM | POA: Diagnosis not present

## 2023-07-10 DIAGNOSIS — E7800 Pure hypercholesterolemia, unspecified: Secondary | ICD-10-CM | POA: Diagnosis not present

## 2023-07-10 DIAGNOSIS — G4733 Obstructive sleep apnea (adult) (pediatric): Secondary | ICD-10-CM | POA: Diagnosis not present

## 2023-07-10 DIAGNOSIS — E875 Hyperkalemia: Secondary | ICD-10-CM | POA: Diagnosis not present

## 2023-07-10 DIAGNOSIS — R053 Chronic cough: Secondary | ICD-10-CM | POA: Diagnosis not present

## 2023-07-10 DIAGNOSIS — M159 Polyosteoarthritis, unspecified: Secondary | ICD-10-CM | POA: Diagnosis not present

## 2023-07-10 DIAGNOSIS — E1169 Type 2 diabetes mellitus with other specified complication: Secondary | ICD-10-CM | POA: Diagnosis not present

## 2023-07-10 DIAGNOSIS — E1122 Type 2 diabetes mellitus with diabetic chronic kidney disease: Secondary | ICD-10-CM | POA: Diagnosis not present

## 2023-07-10 DIAGNOSIS — E1140 Type 2 diabetes mellitus with diabetic neuropathy, unspecified: Secondary | ICD-10-CM | POA: Diagnosis not present

## 2023-07-10 DIAGNOSIS — N390 Urinary tract infection, site not specified: Secondary | ICD-10-CM | POA: Diagnosis not present

## 2023-07-10 NOTE — Telephone Encounter (Signed)
Patient would like referral to Cardiology. Also states Trelegy works better at night. Patient phone number is [phone].

## 2023-07-12 NOTE — Telephone Encounter (Signed)
Attempted to call pt but unable to reach. Left message to return call.  

## 2023-07-13 DIAGNOSIS — G4733 Obstructive sleep apnea (adult) (pediatric): Secondary | ICD-10-CM | POA: Diagnosis not present

## 2023-07-15 ENCOUNTER — Ambulatory Visit: Payer: Medicare HMO | Admitting: Pulmonary Disease

## 2023-07-15 DIAGNOSIS — R0609 Other forms of dyspnea: Secondary | ICD-10-CM

## 2023-07-15 LAB — PULMONARY FUNCTION TEST
DL/VA % pred: 92 %
DL/VA: 3.81 ml/min/mmHg/L
DLCO cor % pred: 79 %
DLCO cor: 13.7 ml/min/mmHg
DLCO unc % pred: 79 %
DLCO unc: 13.7 ml/min/mmHg
FEF 25-75 Post: 2.81 L/s
FEF 25-75 Pre: 2.39 L/s
FEF2575-%Change-Post: 17 %
FEF2575-%Pred-Post: 258 %
FEF2575-%Pred-Pre: 219 %
FEV1-%Change-Post: 2 %
FEV1-%Pred-Post: 111 %
FEV1-%Pred-Pre: 108 %
FEV1-Post: 1.8 L
FEV1-Pre: 1.75 L
FEV1FVC-%Change-Post: 3 %
FEV1FVC-%Pred-Pre: 119 %
FEV6-%Change-Post: 0 %
FEV6-%Pred-Post: 96 %
FEV6-%Pred-Pre: 97 %
FEV6-Post: 1.99 L
FEV6-Pre: 2 L
FEV6FVC-%Pred-Post: 106 %
FEV6FVC-%Pred-Pre: 106 %
FVC-%Change-Post: 0 %
FVC-%Pred-Post: 90 %
FVC-%Pred-Pre: 91 %
FVC-Post: 1.99 L
FVC-Pre: 2 L
Post FEV1/FVC ratio: 90 %
Post FEV6/FVC ratio: 100 %
Pre FEV1/FVC ratio: 87 %
Pre FEV6/FVC Ratio: 100 %
RV % pred: 76 %
RV: 1.82 L
TLC % pred: 81 %
TLC: 3.87 L

## 2023-07-15 NOTE — Patient Instructions (Signed)
Full PFT performed today. °

## 2023-07-15 NOTE — Progress Notes (Signed)
Full PFT performed today.

## 2023-07-17 NOTE — Telephone Encounter (Signed)
Returned call patient who wanted to let Dr. Judeth Horn know she feels Trelegy works better @ night. She also thought he had mentioned something about a referral to cardiology. There was no mention in last ov note. She is ok to discuss this next ov.

## 2023-07-17 NOTE — Telephone Encounter (Signed)
We discussed that if not getting better with Trelegy then we would need to consider cardiology referral. If doing better can hold off until next visit - thanks!

## 2023-07-18 NOTE — Telephone Encounter (Signed)
Detailed message left on identifiable machine belonging to the patient. Advised to call with any questions or concerns.

## 2023-07-29 DIAGNOSIS — G4733 Obstructive sleep apnea (adult) (pediatric): Secondary | ICD-10-CM | POA: Diagnosis not present

## 2023-08-14 DIAGNOSIS — E1122 Type 2 diabetes mellitus with diabetic chronic kidney disease: Secondary | ICD-10-CM | POA: Diagnosis not present

## 2023-08-14 DIAGNOSIS — N184 Chronic kidney disease, stage 4 (severe): Secondary | ICD-10-CM | POA: Diagnosis not present

## 2023-08-14 DIAGNOSIS — E1169 Type 2 diabetes mellitus with other specified complication: Secondary | ICD-10-CM | POA: Diagnosis not present

## 2023-08-14 DIAGNOSIS — N3281 Overactive bladder: Secondary | ICD-10-CM | POA: Diagnosis not present

## 2023-08-14 DIAGNOSIS — G4733 Obstructive sleep apnea (adult) (pediatric): Secondary | ICD-10-CM | POA: Diagnosis not present

## 2023-08-14 DIAGNOSIS — E7800 Pure hypercholesterolemia, unspecified: Secondary | ICD-10-CM | POA: Diagnosis not present

## 2023-08-14 DIAGNOSIS — I10 Essential (primary) hypertension: Secondary | ICD-10-CM | POA: Diagnosis not present

## 2023-08-14 DIAGNOSIS — R053 Chronic cough: Secondary | ICD-10-CM | POA: Diagnosis not present

## 2023-08-14 DIAGNOSIS — E875 Hyperkalemia: Secondary | ICD-10-CM | POA: Diagnosis not present

## 2023-08-14 DIAGNOSIS — M159 Polyosteoarthritis, unspecified: Secondary | ICD-10-CM | POA: Diagnosis not present

## 2023-08-14 DIAGNOSIS — E1140 Type 2 diabetes mellitus with diabetic neuropathy, unspecified: Secondary | ICD-10-CM | POA: Diagnosis not present

## 2023-08-29 DIAGNOSIS — G4733 Obstructive sleep apnea (adult) (pediatric): Secondary | ICD-10-CM | POA: Diagnosis not present

## 2023-09-11 DIAGNOSIS — G4733 Obstructive sleep apnea (adult) (pediatric): Secondary | ICD-10-CM | POA: Diagnosis not present

## 2023-09-11 DIAGNOSIS — M159 Polyosteoarthritis, unspecified: Secondary | ICD-10-CM | POA: Diagnosis not present

## 2023-09-11 DIAGNOSIS — E1122 Type 2 diabetes mellitus with diabetic chronic kidney disease: Secondary | ICD-10-CM | POA: Diagnosis not present

## 2023-09-11 DIAGNOSIS — E1169 Type 2 diabetes mellitus with other specified complication: Secondary | ICD-10-CM | POA: Diagnosis not present

## 2023-09-11 DIAGNOSIS — I10 Essential (primary) hypertension: Secondary | ICD-10-CM | POA: Diagnosis not present

## 2023-09-11 DIAGNOSIS — N184 Chronic kidney disease, stage 4 (severe): Secondary | ICD-10-CM | POA: Diagnosis not present

## 2023-09-11 DIAGNOSIS — E1140 Type 2 diabetes mellitus with diabetic neuropathy, unspecified: Secondary | ICD-10-CM | POA: Diagnosis not present

## 2023-09-11 DIAGNOSIS — R053 Chronic cough: Secondary | ICD-10-CM | POA: Diagnosis not present

## 2023-09-11 DIAGNOSIS — E7800 Pure hypercholesterolemia, unspecified: Secondary | ICD-10-CM | POA: Diagnosis not present

## 2023-09-11 DIAGNOSIS — N3281 Overactive bladder: Secondary | ICD-10-CM | POA: Diagnosis not present

## 2023-09-11 DIAGNOSIS — E875 Hyperkalemia: Secondary | ICD-10-CM | POA: Diagnosis not present

## 2023-09-19 ENCOUNTER — Ambulatory Visit: Payer: Medicare HMO | Admitting: Pulmonary Disease

## 2023-09-19 ENCOUNTER — Telehealth: Payer: Self-pay

## 2023-09-19 ENCOUNTER — Encounter: Payer: Self-pay | Admitting: Pulmonary Disease

## 2023-09-19 VITALS — BP 144/75 | HR 64 | Ht 62.0 in | Wt 200.8 lb

## 2023-09-19 DIAGNOSIS — J45991 Cough variant asthma: Secondary | ICD-10-CM

## 2023-09-19 DIAGNOSIS — R0609 Other forms of dyspnea: Secondary | ICD-10-CM

## 2023-09-19 DIAGNOSIS — G4733 Obstructive sleep apnea (adult) (pediatric): Secondary | ICD-10-CM | POA: Diagnosis not present

## 2023-09-19 DIAGNOSIS — R053 Chronic cough: Secondary | ICD-10-CM | POA: Diagnosis not present

## 2023-09-19 NOTE — Telephone Encounter (Signed)
Faxed form for patient assistance for Trelegy  to [employer]. Waiting and on approval for assistance. Received confirmation that fax was sent to [phone]

## 2023-09-19 NOTE — Patient Instructions (Signed)
Nice to see you again  I am glad the Trelegy is helping  I provided samples today, each sample will last 14 days.  And I be worth calling [place] [facility] to figure out what the issue is with the Trelegy in the new year if the price will decrease throughout the year.   Please fill out paperwork for manufacturing assistance.  You can return this to [place] at a later date or fill out in the room today and return to [place] today.  Please contact me if I can help in any way with issues with medications or worsening symptoms in the future  Return to clinic in 6 months or sooner as needed with Dr. Judeth Horn

## 2023-09-19 NOTE — Progress Notes (Signed)
@Patient  ID: Tanya Munoz, female    DOB: 14-Oct-1940, 82 y.o.   MRN: [id number]  Chief Complaint  Patient presents with   Follow-up    3 month follow up , feel like the inhaler is helping during the day then at night     Referring provider: No ref. provider found  HPI:   82 y.o. woman whom we are seeing for evaluation of chronic cough felt to be related to cough variant asthma.    Overall doing well.  Placed on Trelegy high-dose.  Cough is markedly improved.  She is doing well.  No complaints today.  Using in the morning as she was using it at night before but felt it was wearing out during the day.  With morning use doing better.  Reviewed PFTs obtained in the interim.  These are normal, see below for full interpretation.  Discussed with patient.  HPI at initial visit: Patient notes onset of cough late February 2024.  Out of the blue.  Denies significant fever chills etc. to implicate the upper respiratory infection or viral infection.  Dry cough.  Presented to urgent care.  Chest x-ray was obtained on my review interpretation reveals clear lungs with possible slight hyperinflation.  Similar symptoms persisted despite treatment with doxycycline and rescue inhaler 3 presented to ED 1 month later.  Chest x-ray at that time on my review interpretation reveals clear lungs bilaterally.  Cough seems worse in the evenings.  And with changes in temperature.  No other times a day when things are better or worse.  No position makes his better or worse.  No other seasonal or environmental factors she can identify to make things better or worse.  She is unsure if the inhaler really has helped at all.  No other really alleviating or exacerbating factors.  At 1 point time, she is on lisinopril this medication was stopped.  However not significant or longstanding improvement in symptoms despite this.  She does report medication adherence to her multiple blood pressure medicines and with decrease and  taking her blood pressure medicine thinks the cough has improved a little bit.  Past medical history: Diabetes, hypertension Surgical history: Hysterectomy, knee replacement Family history:History reviewed. No pertinent family history. Social history: Never smoker, retired, lives in [place] / Pulmonary Flowsheets:   ACT:      No data to display          MMRC:     No data to display          Epworth:      No data to display          Tests:   FENO:  No results found for: "NITRICOXIDE"  PFT:    Latest Ref Rng & Units 07/15/2023   10:55 AM  PFT Results  FVC-Pre L 2.00   FVC-Predicted Pre % 91   FVC-Post L 1.99   FVC-Predicted Post % 90   Pre FEV1/FVC % % 87   Post FEV1/FCV % % 90   FEV1-Pre L 1.75   FEV1-Predicted Pre % 108   FEV1-Post L 1.80   DLCO uncorrected ml/min/mmHg 13.70   DLCO UNC% % 79   DLCO corrected ml/min/mmHg 13.70   DLCO COR %Predicted % 79   DLVA Predicted % 92   TLC L 3.87   TLC % Predicted % 81   RV % Predicted % 76   Personally interpreted normal spirometry.  No bronchodilator sponsor.  Lung volumes within  normal notes.  DLCO within normal limits.  WALK:      No data to display          Imaging: Personally reviewed and as per EMR and discussion this note No results found.  Lab Results: Personally reviewed CBC    Component Value Date/Time   WBC 6.4 08/20/2011 0610   RBC 3.32 (L) 08/20/2011 0610   HGB 9.5 (L) 08/20/2011 0610   HCT 30.0 (L) 08/20/2011 0610   PLT 240 08/20/2011 0610   MCV 90.4 08/20/2011 0610   MCH 28.6 08/20/2011 0610   MCHC 31.7 08/20/2011 0610   RDW 13.2 08/20/2011 0610   LYMPHSABS 1.3 08/17/2011 1638   MONOABS 0.7 08/17/2011 1638   EOSABS 0.1 08/17/2011 1638   BASOSABS 0.0 08/17/2011 1638    BMET    Component Value Date/Time   NA 134 (L) 08/20/2011 0610   K 4.0 08/20/2011 0610   CL 98 08/20/2011 0610   CO2 26 08/20/2011 0610   GLUCOSE 128 (H) 08/20/2011 0610   BUN  17 08/20/2011 0610   CREATININE 0.84 08/20/2011 0610   CALCIUM 9.1 08/20/2011 0610   GFRNONAA 68 (L) 08/20/2011 0610   GFRAA 79 (L) 08/20/2011 0610    BNP No results found for: "BNP"  ProBNP No results found for: "PROBNP"  Specialty Problems       Pulmonary Problems   Dyspnea on exertion   OSA on CPAP    No Known Allergies  Immunization History  Administered Date(s) Administered   Fluad Quad(high Dose 65+) 06/18/2019   Influenza, High Dose Seasonal PF 07/27/2018   Influenza-Unspecified 05/26/2023   Moderna Sars-Covid-2 Vaccination 11/06/2019, 12/02/2019    Past Medical History:  Diagnosis Date   Arthritis    arthritis in knee and shoulder   Chronic kidney disease    uti about a month ago - treated with abx   Diabetes mellitus    diagnosed 6-7 yrs ago - takes metformin   GERD (gastroesophageal reflux disease)    h/o acid reflux - took zantac   Hyperlipidemia    Hypertension    saw a cardiologist in [place] for surgical clearance    Tobacco History: Social History   Tobacco Use  Smoking Status Never  Smokeless Tobacco Never   Counseling given: Not Answered   Continue to not smoke  Outpatient Encounter Medications as of 09/19/2023  Medication Sig   albuterol (VENTOLIN HFA) 108 (90 Base) MCG/ACT inhaler Inhale 2 puffs into the lungs every 6 (six) hours as needed for wheezing or shortness of breath.   amLODipine (NORVASC) 10 MG tablet Take 10 mg by mouth daily.   aspirin 81 MG tablet Take 81 mg by mouth daily.     atorvastatin (LIPITOR) 40 MG tablet Take 40 mg by mouth daily.   Fluticasone-Umeclidin-Vilant (TRELEGY ELLIPTA) 200-62.5-25 MCG/ACT AEPB Inhale 1 puff into the lungs daily.   metFORMIN (GLUCOPHAGE) 850 MG tablet Take 850 mg by mouth 2 (two) times daily with a meal.     metoprolol tartrate (LOPRESSOR) 50 MG tablet Take 50 mg by mouth 2 (two) times daily.   traMADol (ULTRAM) 50 MG tablet Take 50 mg by mouth every 6 (six) hours as needed.    trimethoprim (TRIMPEX) 100 MG tablet Take 100 mg by mouth daily.   valsartan (DIOVAN) 320 MG tablet Take 320 mg by mouth daily.   No facility-administered encounter medications on file as of 09/19/2023.     Review of Systems  Review of Systems  N/a  Physical Exam  BP (!) 144/75   Pulse 64   Ht 5\' 2"  (1.575 m)   Wt 200 lb 12.8 oz (91.1 kg)   SpO2 99% Comment: room air  BMI 36.73 kg/m   Wt Readings from Last 5 Encounters:  09/19/23 200 lb 12.8 oz (91.1 kg)  06/13/23 201 lb 6.4 oz (91.4 kg)  03/13/23 198 lb (89.8 kg)  01/26/20 178 lb 12.8 oz (81.1 kg)  10/20/19 198 lb (89.8 kg)    BMI Readings from Last 5 Encounters:  09/19/23 36.73 kg/m  06/13/23 36.84 kg/m  03/13/23 36.21 kg/m  01/26/20 30.69 kg/m  10/20/19 32.95 kg/m     Physical Exam General: Sitting in chair, no acute distress Eyes: EOMI, no icterus Neck: Supple, no JVP Pulmonary: Clear, no work of breathing Cardiovascular: Warm, no edema Abdomen: Nondistended, soft present MSK: No synovitis, no joint effusion Neuro: Normal gait, no weakness Psych: Normal mood, full affect   Assessment & Plan:   Chronic cough: Dry.  Chest x-ray clear x 2.  Present for months.  Denies postnasal drip or GERD symptoms.  Mild hyperinflation on chest x-ray in the past and more recently.  Felt could be cough variant asthma.  Worse at night.  Cough essentially resolved with mid dose Advair discus twice daily.  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  Subsequently escalated to Trelegy as below for dyspnea and remains improved.  Dyspnea on exertion: Suspect related to mild asthma.  PFTs within normal limits.  Advair escalated Trelegy.  Symptoms improved.  Continue Trelegy.  OSA on CPAP: She reports good adherence.  Managed elsewhere.  Encouraged to continue CPAP therapy.  She is receiving significant clinical benefit from CPAP use.   Return in about 6 months (around 03/19/2024) for f/u Dr. Judeth Horn.   Karren Burly, MD 09/19/2023

## 2023-09-28 DIAGNOSIS — G4733 Obstructive sleep apnea (adult) (pediatric): Secondary | ICD-10-CM | POA: Diagnosis not present

## 2024-01-21 ENCOUNTER — Telehealth: Payer: Self-pay

## 2024-01-21 NOTE — Telephone Encounter (Signed)
 Copied from CRM [id number]. Topic: Clinical - Prescription Issue >> Jan 14, 2024  2:32 PM Crist Dominion wrote: Reason for CRM: Patient is requesting to speak with Dr. Ames Justin nurse about the cost of her fluticasone -Umeclidin-Vilant (TRELEGY ELLIPTA ) 200-62.5-25 MCG/ACT AEPB as she states she filled out paperwork at her last appointment for a medication assistance program and has not received an update.  Per phone note on 09/19/23 the phone. The information was faxed to [employer] for patient. When I floating to help this office out.

## 2024-02-06 NOTE — Telephone Encounter (Signed)
 Atc x1. lmtcb

## 2024-04-13 ENCOUNTER — Ambulatory Visit: Admitting: Pulmonary Disease

## 2024-04-13 ENCOUNTER — Encounter: Payer: Self-pay | Admitting: Pulmonary Disease

## 2024-04-13 ENCOUNTER — Telehealth: Payer: Self-pay

## 2024-04-13 VITALS — BP 146/75 | HR 69 | Ht 64.0 in | Wt 202.0 lb

## 2024-04-13 DIAGNOSIS — G4733 Obstructive sleep apnea (adult) (pediatric): Secondary | ICD-10-CM

## 2024-04-13 DIAGNOSIS — R0609 Other forms of dyspnea: Secondary | ICD-10-CM | POA: Diagnosis not present

## 2024-04-13 DIAGNOSIS — R053 Chronic cough: Secondary | ICD-10-CM

## 2024-04-13 MED ORDER — BREZTRI AEROSPHERE 160-9-4.8 MCG/ACT IN AERO: 2.0000 | INHALATION_SPRAY | Freq: Two times a day (BID) | RESPIRATORY_TRACT | 4 refills | Status: DC

## 2024-04-13 MED ORDER — BREZTRI AEROSPHERE 160-9-4.8 MCG/ACT IN AERO: INHALATION_SPRAY | RESPIRATORY_TRACT | Status: AC

## 2024-04-13 NOTE — Progress Notes (Signed)
 @Patient  ID: Tanya Munoz, female    DOB: 1941-03-16, 83 y.o.   MRN: [id number]  Chief Complaint  Patient presents with   Follow-up    Referring provider: No ref. provider found  HPI:   83 y.o. woman whom we are seeing for evaluation of chronic cough felt to be related to cough variant asthma.  Chief complaint today of dyspnea on exertion.  Multiple telephone encounter notes from our office reviewed.  Returns for routine follow-up.  Unfortunately Trelegy too expensive.  First time hearing of this.  Documentation 09/2023 demonstrates we faxed off many fraction assistance.  It seems we never heard back.  Patient never heard back either she says.  She has been without this inhaler for some time.  Historically Trelegy has helped significantly with dyspnea exertion as well as cough.  Cough much better overall.  But still significant dyspnea on exertion.  HPI at initial visit: Patient notes onset of cough late February 2024.  Out of the blue.  Denies significant fever chills etc. to implicate the upper respiratory infection or viral infection.  Dry cough.  Presented to urgent care.  Chest x-ray was obtained on my review interpretation reveals clear lungs with possible slight hyperinflation.  Similar symptoms persisted despite treatment with doxycycline and rescue inhaler 3 presented to ED 1 month later.  Chest x-ray at that time on my review interpretation reveals clear lungs bilaterally.  Cough seems worse in the evenings.  And with changes in temperature.  No other times a day when things are better or worse.  No position makes his better or worse.  No other seasonal or environmental factors she can identify to make things better or worse.  She is unsure if the inhaler really has helped at all.  No other really alleviating or exacerbating factors.  At 1 point time, she is on lisinopril this medication was stopped.  However not significant or longstanding improvement in symptoms despite this.  She  does report medication adherence to her multiple blood pressure medicines and with decrease and taking her blood pressure medicine thinks the cough has improved a little bit.  Past medical history: Diabetes, hypertension Surgical history: Hysterectomy, knee replacement Family history:History reviewed. No pertinent family history. Social history: Never smoker, retired, lives in [place] / Pulmonary Flowsheets:   ACT:      No data to display          MMRC:     No data to display          Epworth:      No data to display          Tests:   FENO:  No results found for: NITRICOXIDE  PFT:    Latest Ref Rng & Units 07/15/2023   10:55 AM  PFT Results  FVC-Pre L 2.00   FVC-Predicted Pre % 91   FVC-Post L 1.99   FVC-Predicted Post % 90   Pre FEV1/FVC % % 87   Post FEV1/FCV % % 90   FEV1-Pre L 1.75   FEV1-Predicted Pre % 108   FEV1-Post L 1.80   DLCO uncorrected ml/min/mmHg 13.70   DLCO UNC% % 79   DLCO corrected ml/min/mmHg 13.70   DLCO COR %Predicted % 79   DLVA Predicted % 92   TLC L 3.87   TLC % Predicted % 81   RV % Predicted % 76   Personally interpreted normal spirometry.  No bronchodilator sponsor.  Lung volumes within normal  notes.  DLCO within normal limits.  WALK:      No data to display          Imaging: Personally reviewed and as per EMR and discussion this note No results found.  Lab Results: Personally reviewed CBC    Component Value Date/Time   WBC 6.4 08/20/2011 0610   RBC 3.32 (L) 08/20/2011 0610   HGB 9.5 (L) 08/20/2011 0610   HCT 30.0 (L) 08/20/2011 0610   PLT 240 08/20/2011 0610   MCV 90.4 08/20/2011 0610   MCH 28.6 08/20/2011 0610   MCHC 31.7 08/20/2011 0610   RDW 13.2 08/20/2011 0610   LYMPHSABS 1.3 08/17/2011 1638   MONOABS 0.7 08/17/2011 1638   EOSABS 0.1 08/17/2011 1638   BASOSABS 0.0 08/17/2011 1638    BMET    Component Value Date/Time   NA 134 (L) 08/20/2011 0610   K 4.0 08/20/2011  0610   CL 98 08/20/2011 0610   CO2 26 08/20/2011 0610   GLUCOSE 128 (H) 08/20/2011 0610   BUN 17 08/20/2011 0610   CREATININE 0.84 08/20/2011 0610   CALCIUM 9.1 08/20/2011 0610   GFRNONAA 68 (L) 08/20/2011 0610   GFRAA 79 (L) 08/20/2011 0610    BNP No results found for: BNP  ProBNP No results found for: PROBNP  Specialty Problems       Pulmonary Problems   Dyspnea on exertion   OSA on CPAP    No Known Allergies  Immunization History  Administered Date(s) Administered   Fluad Quad(high Dose 65+) 06/18/2019   Influenza, High Dose Seasonal PF 07/27/2018   Influenza-Unspecified 05/26/2023   Moderna Sars-Covid-2 Vaccination 11/06/2019, 12/02/2019    Past Medical History:  Diagnosis Date   Arthritis    arthritis in knee and shoulder   Chronic kidney disease    uti about a month ago - treated with abx   Diabetes mellitus    diagnosed 6-7 yrs ago - takes metformin    GERD (gastroesophageal reflux disease)    h/o acid reflux - took zantac   Hyperlipidemia    Hypertension    saw a cardiologist in [place] for surgical clearance    Tobacco History: Social History   Tobacco Use  Smoking Status Never  Smokeless Tobacco Never   Counseling given: Not Answered   Continue to not smoke  Outpatient Encounter Medications as of 04/13/2024  Medication Sig   albuterol  (VENTOLIN  HFA) 108 (90 Base) MCG/ACT inhaler Inhale 2 puffs into the lungs every 6 (six) hours as needed for wheezing or shortness of breath.   amLODipine (NORVASC) 10 MG tablet Take 10 mg by mouth daily.   aspirin  81 MG tablet Take 81 mg by mouth daily.     atorvastatin (LIPITOR) 40 MG tablet Take 40 mg by mouth daily.   Fluticasone -Umeclidin-Vilant (TRELEGY ELLIPTA ) 200-62.5-25 MCG/ACT AEPB Inhale 1 puff into the lungs daily.   metFORMIN  (GLUCOPHAGE ) 850 MG tablet Take 850 mg by mouth 2 (two) times daily with a meal.     metoprolol tartrate (LOPRESSOR) 50 MG tablet Take 50 mg by mouth 2 (two) times  daily.   traMADol  (ULTRAM ) 50 MG tablet Take 50 mg by mouth every 6 (six) hours as needed.   trimethoprim (TRIMPEX) 100 MG tablet Take 100 mg by mouth daily.   valsartan (DIOVAN) 320 MG tablet Take 320 mg by mouth daily.   No facility-administered encounter medications on file as of 04/13/2024.     Review of Systems  Review of Systems  N/a Physical  Exam  BP (!) 146/75 (BP Location: Left Arm, Patient Position: Sitting, Cuff Size: Large)   Pulse 69   Ht 5' 4 (1.626 m)   Wt 202 lb (91.6 kg)   SpO2 96%   BMI 34.67 kg/m   Wt Readings from Last 5 Encounters:  04/13/24 202 lb (91.6 kg)  09/19/23 200 lb 12.8 oz (91.1 kg)  06/13/23 201 lb 6.4 oz (91.4 kg)  03/13/23 198 lb (89.8 kg)  01/26/20 178 lb 12.8 oz (81.1 kg)    BMI Readings from Last 5 Encounters:  04/13/24 34.67 kg/m  09/19/23 36.73 kg/m  06/13/23 36.84 kg/m  03/13/23 36.21 kg/m  01/26/20 30.69 kg/m     Physical Exam General: Sitting in chair, no acute distress Eyes: EOMI, no icterus Neck: Supple, no JVP Pulmonary: Clear, no work of breathing Cardiovascular: Warm, no edema Abdomen: Nondistended, soft present MSK: No synovitis, no joint effusion Neuro: Normal gait, no weakness Psych: Normal mood, full affect   Assessment & Plan:   Chronic cough, likely chronic bronchitis: Dry.  Chest x-ray clear x 2.  Present for months.  Denies postnasal drip or GERD symptoms.  Mild hyperinflation on chest x-ray in the past and more recently.  Felt could be cough variant asthma.  Worse at night.  Cough essentially resolved with mid dose Advair discus twice daily.  Remains well-controlled on Trelegy.  However too expensive.  Trial Breztri  as below.  Manufacturing paperwork for this today.  Dyspnea on exertion: Suspect related to mild asthma.  PFTs within normal limits.  Advair escalated Trelegy.  Symptoms improved.  No lower longer has access to Trelegy with too high of cost.  Will try Breztri , manufacturing assistance  paperwork today.  OSA on CPAP: She reports good adherence.  Managed elsewhere.  Encouraged to continue CPAP therapy.  She is receiving significant clinical benefit from CPAP use.   Return in about 3 months (around 07/14/2024) for f/u Dr. Annella.   Donnice JONELLE Annella, MD 04/13/2024

## 2024-04-13 NOTE — Addendum Note (Signed)
 Addended by: ARMAND SOR R on: 04/13/2024 10:47 AM   Modules accepted: Orders

## 2024-04-13 NOTE — Patient Instructions (Signed)
 It is good to see you again  I am glad the Trelegy helped in the past am sorry it is too expensive  Try different inhaler that is very similar -Breztri  2 puffs twice a day every day, rinse your mouth out thoroughly with water after use  Will fill out paperwork today to see if the manufacture will help us  pay for it, get it at a lower cost  Please call us  and let us  know if you have not heard back from us  in terms of getting this approved or if the inhaler does not help  Return to clinic in 3 months or sooner as needed with Dr. Annella

## 2024-04-13 NOTE — Telephone Encounter (Signed)
 Breztri  [employer] paperwork faxed

## 2024-04-15 ENCOUNTER — Telehealth: Payer: Self-pay

## 2024-04-15 NOTE — Telephone Encounter (Signed)
 Copied from CRM [id number]. Topic: Clinical - Prescription Issue >> Apr 14, 2024  3:22 PM Tanya Munoz wrote: Reason for CRM: Patient is requesting an update from Dr. Sherry nurse to check if she has heard anything back from [employer] regarding her Breztri  inhaler.   Lm- okay per HIPAA   Stated that since she just filled out and we faxed that paper work on 07/14 its can take some time and she will hear from them not us      NFN-

## 2024-05-19 ENCOUNTER — Other Ambulatory Visit: Payer: Self-pay

## 2024-05-19 NOTE — Progress Notes (Signed)
 Faxed updated medication list to [facility] pharmacy at [phone]

## 2024-05-21 ENCOUNTER — Telehealth: Payer: Self-pay

## 2024-05-21 NOTE — Telephone Encounter (Signed)
 Faxed updated medication list to [facility] at [phone]. Received fax confirmaton. NFN

## 2024-07-14 ENCOUNTER — Ambulatory Visit: Admitting: Pulmonary Disease

## 2024-07-14 ENCOUNTER — Encounter: Payer: Self-pay | Admitting: Pulmonary Disease

## 2024-07-14 VITALS — BP 138/58 | HR 66 | Ht 64.0 in | Wt 202.0 lb

## 2024-07-14 DIAGNOSIS — G4733 Obstructive sleep apnea (adult) (pediatric): Secondary | ICD-10-CM

## 2024-07-14 DIAGNOSIS — R0609 Other forms of dyspnea: Secondary | ICD-10-CM

## 2024-07-14 DIAGNOSIS — J42 Unspecified chronic bronchitis: Secondary | ICD-10-CM

## 2024-07-14 NOTE — Patient Instructions (Signed)
 Nice to see you again  Continue Breztri  2 puffs twice a day, rinse your mouth out with water after every use  Contact us  as in December for paperwork to renew the manufacture and assistance for the Breztri   Return to clinic in 6 months or sooner as needed with Dr. Annella

## 2024-07-14 NOTE — Progress Notes (Signed)
 @Patient  ID: Tanya Munoz, female    DOB: 1941-08-17, 83 y.o.   MRN: [id number]  Chief Complaint  Patient presents with   Medical Management of Chronic Issues    INH working well    Referring provider: No ref. provider found  HPI:   83 y.o. woman whom we are seeing for evaluation of chronic cough and dyspnea on exertion felt to be related to asthma.  PFTs normal 07/2023.  Multiple telephone encounter notes from our office reviewed.  Returns for routine follow-up.  Historically, symptoms improved, cough and dyspnea exertion, with Trelegy.  But this too expensive cannot reliably get this.  Prescribed Breztri  at last visit.  Via Technical sales engineer.  Symptoms better.  Cough and dyspnea exertion not an issue.  Using Breztri  reliably.  Has been able to get this reliably via manufacture and assistance.  HPI at initial visit: Patient notes onset of cough late February 2024.  Out of the blue.  Denies significant fever chills etc. to implicate the upper respiratory infection or viral infection.  Dry cough.  Presented to urgent care.  Chest x-ray was obtained on my review interpretation reveals clear lungs with possible slight hyperinflation.  Similar symptoms persisted despite treatment with doxycycline and rescue inhaler 3 presented to ED 1 month later.  Chest x-ray at that time on my review interpretation reveals clear lungs bilaterally.  Cough seems worse in the evenings.  And with changes in temperature.  No other times a day when things are better or worse.  No position makes his better or worse.  No other seasonal or environmental factors she can identify to make things better or worse.  She is unsure if the inhaler really has helped at all.  No other really alleviating or exacerbating factors.  At 1 point time, she is on lisinopril this medication was stopped.  However not significant or longstanding improvement in symptoms despite this.  She does report medication adherence to her  multiple blood pressure medicines and with decrease and taking her blood pressure medicine thinks the cough has improved a little bit.  Past medical history: Diabetes, hypertension Surgical history: Hysterectomy, knee replacement Family history:History reviewed. No pertinent family history. Social history: Never smoker, retired, lives in [place] / Pulmonary Flowsheets:   ACT:      No data to display          MMRC:     No data to display          Epworth:      No data to display          Tests:   FENO:  No results found for: NITRICOXIDE  PFT:    Latest Ref Rng & Units 07/15/2023   10:55 AM  PFT Results  FVC-Pre L 2.00   FVC-Predicted Pre % 91   FVC-Post L 1.99   FVC-Predicted Post % 90   Pre FEV1/FVC % % 87   Post FEV1/FCV % % 90   FEV1-Pre L 1.75   FEV1-Predicted Pre % 108   FEV1-Post L 1.80   DLCO uncorrected ml/min/mmHg 13.70   DLCO UNC% % 79   DLCO corrected ml/min/mmHg 13.70   DLCO COR %Predicted % 79   DLVA Predicted % 92   TLC L 3.87   TLC % Predicted % 81   RV % Predicted % 76   Personally interpreted normal spirometry.  No bronchodilator sponsor.  Lung volumes within normal notes.  DLCO within normal limits.  WALK:      No data to display          Imaging: Personally reviewed and as per EMR and discussion this note No results found.  Lab Results: Personally reviewed CBC    Component Value Date/Time   WBC 6.4 08/20/2011 0610   RBC 3.32 (L) 08/20/2011 0610   HGB 9.5 (L) 08/20/2011 0610   HCT 30.0 (L) 08/20/2011 0610   PLT 240 08/20/2011 0610   MCV 90.4 08/20/2011 0610   MCH 28.6 08/20/2011 0610   MCHC 31.7 08/20/2011 0610   RDW 13.2 08/20/2011 0610   LYMPHSABS 1.3 08/17/2011 1638   MONOABS 0.7 08/17/2011 1638   EOSABS 0.1 08/17/2011 1638   BASOSABS 0.0 08/17/2011 1638    BMET    Component Value Date/Time   NA 134 (L) 08/20/2011 0610   K 4.0 08/20/2011 0610   CL 98 08/20/2011 0610   CO2 26  08/20/2011 0610   GLUCOSE 128 (H) 08/20/2011 0610   BUN 17 08/20/2011 0610   CREATININE 0.84 08/20/2011 0610   CALCIUM 9.1 08/20/2011 0610   GFRNONAA 68 (L) 08/20/2011 0610   GFRAA 79 (L) 08/20/2011 0610    BNP No results found for: BNP  ProBNP No results found for: PROBNP  Specialty Problems       Pulmonary Problems   Dyspnea on exertion   OSA on CPAP    No Known Allergies  Immunization History  Administered Date(s) Administered   Fluad Quad(high Dose 65+) 06/18/2019   INFLUENZA, HIGH DOSE SEASONAL PF 07/27/2018   Influenza-Unspecified 05/26/2023   Moderna Sars-Covid-2 Vaccination 11/06/2019, 12/02/2019    Past Medical History:  Diagnosis Date   Arthritis    arthritis in knee and shoulder   Chronic kidney disease    uti about a month ago - treated with abx   Diabetes mellitus    diagnosed 6-7 yrs ago - takes metformin    GERD (gastroesophageal reflux disease)    h/o acid reflux - took zantac   Hyperlipidemia    Hypertension    saw a cardiologist in [place] for surgical clearance    Tobacco History: Social History   Tobacco Use  Smoking Status Never  Smokeless Tobacco Never   Counseling given: Not Answered   Continue to not smoke  Outpatient Encounter Medications as of 07/14/2024  Medication Sig   albuterol  (VENTOLIN  HFA) 108 (90 Base) MCG/ACT inhaler Inhale 2 puffs into the lungs every 6 (six) hours as needed for wheezing or shortness of breath.   amLODipine (NORVASC) 10 MG tablet Take 10 mg by mouth daily.   aspirin  81 MG tablet Take 81 mg by mouth daily.     atorvastatin (LIPITOR) 40 MG tablet Take 40 mg by mouth daily.   budesonide-glycopyrrolate -formoterol (BREZTRI  AEROSPHERE) 160-9-4.8 MCG/ACT AERO inhaler Inhale 2 puffs into the lungs in the morning and at bedtime.   budesonide-glycopyrrolate -formoterol (BREZTRI  AEROSPHERE) 160-9-4.8 MCG/ACT AERO inhaler 4 samples   cetirizine (ZYRTEC) 10 MG tablet Take 10 mg by mouth daily as needed.    dicyclomine (BENTYL) 20 MG tablet Take 20 mg by mouth 4 (four) times daily as needed.   Fluticasone -Umeclidin-Vilant (TRELEGY ELLIPTA ) 200-62.5-25 MCG/ACT AEPB Inhale 1 puff into the lungs daily.   metFORMIN  (GLUCOPHAGE ) 850 MG tablet Take 850 mg by mouth 2 (two) times daily with a meal.     metoprolol tartrate (LOPRESSOR) 50 MG tablet Take 50 mg by mouth 2 (two) times daily.   montelukast (SINGULAIR) 10 MG tablet SMARTSIG:1 Tablet(s) By Mouth  Every Evening   oxybutynin (DITROPAN-XL) 10 MG 24 hr tablet Take 10 mg by mouth daily.   traMADol  (ULTRAM ) 50 MG tablet Take 50 mg by mouth every 6 (six) hours as needed.   trimethoprim (TRIMPEX) 100 MG tablet Take 100 mg by mouth daily.   valsartan (DIOVAN) 320 MG tablet Take 320 mg by mouth daily.   No facility-administered encounter medications on file as of 07/14/2024.     Review of Systems  Review of Systems  N/a Physical Exam  BP (!) 138/58   Pulse 66   Ht 5' 4 (1.626 m)   Wt 202 lb (91.6 kg)   SpO2 99%   BMI 34.67 kg/m   Wt Readings from Last 5 Encounters:  07/14/24 202 lb (91.6 kg)  04/13/24 202 lb (91.6 kg)  09/19/23 200 lb 12.8 oz (91.1 kg)  06/13/23 201 lb 6.4 oz (91.4 kg)  03/13/23 198 lb (89.8 kg)    BMI Readings from Last 5 Encounters:  07/14/24 34.67 kg/m  04/13/24 34.67 kg/m  09/19/23 36.73 kg/m  06/13/23 36.84 kg/m  03/13/23 36.21 kg/m     Physical Exam General: Sitting in chair, no acute distress Eyes: EOMI, no icterus Neck: Supple, no JVP Pulmonary: Clear, no work of breathing Cardiovascular: Warm, no edema Abdomen: Nondistended, soft present MSK: No synovitis, no joint effusion Neuro: Normal gait, no weakness Psych: Normal mood, full affect   Assessment & Plan:   Chronic cough, likely chronic bronchitis: Dry.  Chest x-ray clear x 2.  Present for months.  Denies postnasal drip or GERD symptoms.  Mild hyperinflation on chest x-ray in the past and more recently.  Felt could be cough variant  asthma.  Worse at night.  Cough essentially resolved with mid dose Advair discus twice daily.  Remains well-controlled on Trelegy.  However too expensive.  Improved, essentially resolved with Breztri .  Continue this.  Dyspnea on exertion: Suspect related to mild asthma.  PFTs within normal limits.  Advair escalated Trelegy.  Symptoms improved.  Now on Breztri  with resolution of symptoms.  OSA on CPAP: She reports good adherence.  Managed elsewhere.  Encouraged to continue CPAP therapy.  She is receiving significant clinical benefit from CPAP use.   Return in about 6 months (around 01/12/2025) for f/u Dr. Annella.   Donnice JONELLE Annella, MD 07/14/2024

## 2024-08-26 ENCOUNTER — Telehealth: Payer: Self-pay | Admitting: Pulmonary Disease

## 2024-08-26 NOTE — Telephone Encounter (Signed)
 Patient has dropped off AZ[employer] Me from. Form placed in Dr. Leatha box.

## 2024-08-31 ENCOUNTER — Other Ambulatory Visit: Payer: Self-pay

## 2024-08-31 MED ORDER — BREZTRI AEROSPHERE 160-9-4.8 MCG/ACT IN AERO: 2.0000 | INHALATION_SPRAY | Freq: Two times a day (BID) | RESPIRATORY_TRACT | 3 refills | Status: AC

## 2024-09-03 NOTE — Telephone Encounter (Signed)
 Channing, I am unable to locate this form. It was not in [place]. [employer] or look at folder. Do you have a copy of it?

## 2024-09-08 NOTE — Telephone Encounter (Signed)
 Spoke to pt. She stated that she does not have a copy of the forms. She will reach out to her daughter to see if she has a copy.

## 2024-09-08 NOTE — Telephone Encounter (Signed)
 We do not have a copy up front.

## 2025-01-20 ENCOUNTER — Ambulatory Visit: Admitting: Pulmonary Disease
